# Patient Record
Sex: Male | Born: 2005 | Race: White | Hispanic: No | Marital: Single | Smoking: Never smoker
Health system: Southern US, Community
[De-identification: ages and names within clinical notes are randomized; demographics above are authoritative.]

## PROBLEM LIST (undated history)

## (undated) ENCOUNTER — Ambulatory Visit (HOSPITAL_COMMUNITY): Admission: EM | Payer: Medicaid Other | Source: Home / Self Care

## (undated) DIAGNOSIS — K5190 Ulcerative colitis, unspecified, without complications: Secondary | ICD-10-CM

## (undated) HISTORY — DX: Ulcerative colitis, unspecified, without complications: K51.90

---
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
## 2023-01-04 ENCOUNTER — Ambulatory Visit: Payer: Medicaid Other | Admitting: Family Medicine

## 2023-01-05 ENCOUNTER — Other Ambulatory Visit: Payer: Self-pay

## 2023-01-07 ENCOUNTER — Other Ambulatory Visit (HOSPITAL_COMMUNITY): Payer: Self-pay

## 2023-01-21 ENCOUNTER — Other Ambulatory Visit: Payer: Self-pay

## 2023-01-21 ENCOUNTER — Other Ambulatory Visit (INDEPENDENT_AMBULATORY_CARE_PROVIDER_SITE_OTHER): Payer: Medicaid Other

## 2023-01-21 ENCOUNTER — Ambulatory Visit (INDEPENDENT_AMBULATORY_CARE_PROVIDER_SITE_OTHER): Payer: Medicaid Other | Admitting: Sports Medicine

## 2023-01-21 ENCOUNTER — Encounter: Payer: Self-pay | Admitting: Sports Medicine

## 2023-01-21 DIAGNOSIS — M79604 Pain in right leg: Secondary | ICD-10-CM

## 2023-01-21 DIAGNOSIS — M76821 Posterior tibial tendinitis, right leg: Secondary | ICD-10-CM

## 2023-01-21 NOTE — Progress Notes (Signed)
Weeks of pain No injury Pain is in the medial ankle Wearing KT tape on that inner ankle

## 2023-01-21 NOTE — Progress Notes (Signed)
Shawn Novak - 17 y.o. male MRN [id number]  Date of birth: 2006-04-02  Office Visit Note: Visit Date: 01/21/2023 PCP: Silvano Rusk, MD Referred by: Silvano Rusk, MD  Subjective: Chief Complaint  Patient presents with   Right Ankle - Pain   HPI: Shawn Novak is a pleasant 17 y.o. male who presents today for evaluation of right medial lower leg pain that first began ~2 weeks ago.  Using index finger to localize pain, he point to medial lower leg, approximately 2 cm proximal & posterior to his medial malleolus. 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 He discontinued running for 1 week to allow injury to rest but continued with cross-training and started some rehab exercises he found online. Has also done some ice and KT tapping. Denies pain w/ ADLs or cross training. Slowly began running again this week but as he increased his daily mileage the pain returned. Has not used any oral or topical medications since onset of injury.   Pertinent ROS were reviewed with the patient and found to be negative unless otherwise specified above in HPI.   Assessment & Plan: Visit Diagnoses:  1. Posterior tibial tendinitis of right lower extremity   2. Pain in right leg    Plan: Discussed with Addison he has a form of posterior tibial tendinitis which is ultrasound and exam suggest a friction type syndrome with the overlying FDL and PT tendon.  Structurally the tendons looked sound and I do not see any evidence of bony abnormality on x-ray or ultrasound.  We will hold him for running for 2 weeks, he will continue crosstraining as well as some stretching and strengthening exercises for the posterior tibial tendon.  We did place him into green sports insoles with a scaphoid pad to help support his arch as he does have mild to moderate prominent pes cavus.  He will use ice as well as topical Voltaren gel over his  painful area.  Will limit oral NSAIDs given his history of stomach issues and Crohn's disease.  Follow-up: Return if symptoms worsen or fail to improve, for for leg (30-mins for Ultrasound if makes appt).   Meds & Orders: No orders of the defined types were placed in this encounter.   Orders Placed This Encounter  Procedures   XR Ankle Complete Right   [place] Extrem Low Right Ltd     Procedures: No procedures performed      Clinical History: No specialty comments available.  He reports that he has never smoked. He has never used smokeless tobacco. No results for input(s): "HGBA1C", "LABURIC" in the last 8760 hours.  Objective:    Physical Exam  Gen: Well-appearing, in no acute distress; non-toxic CV: Well-perfused. Warm.  Resp: Breathing unlabored on room air; no wheezing. Psych: Fluid speech in conversation; appropriate affect; normal thought process Neuro: Sensation intact throughout. No gross coordination deficits.   Ortho Exam No erythema, ecchymosis, or significant edema near near area of concern along path of posterior tibialis tendon.  Mild TTP over MT JNX of posterior tibialis. Denies TTP along anterior tibia or fibula Passive/active ROM within normal limits No reproducible pain with plantarflexion, dorsiflexion, inversion, or eversion of ankle Ankle anterior drawer testing shows adequate stability. Tinel sign negative when palpating over posterior tibial nerve. Denies any radiation of pain No evidence of neurovascular compromise  Imaging: [place] Extrem Low  Right Ltd  Result Date: 01/21/2023 Limited musculoskeletal ultrasound of the right lower extremity, right medial ankle was performed today.  Evaluation of the posterior tibial tendon showed a mild degree of hypoechoic fluid on the exterior part of the sheath approximately 3 inches proximal to the medial malleolus.  There is no cortical irregularity tear of the medial tibia.  There is associated sono palpation in this  region with the overlying flexor digitorum longus crossing over the posterior tibial tendon.  There is a reproducible clicking sensation when testing dynamically, likely indicative of a degree of intersection syndrome between the posterior tibial tendon and flexor digitorum longus.  This occurs just near the myotendinous junction of the PT tendon.  There is no evidence of high-grade tearing of either of these tendons.  XR Ankle Complete Right  Result Date: 01/21/2023 3 views of the right ankle including AP, oblique and lateral film were ordered and reviewed by myself.  X-rays demonstrate mild cavus foot.  No acute bony fracture or other bony abnormality noted.   Past Medical/Family/Surgical/Social History: Medications & Allergies reviewed per EMR, new medications updated. Patient Active Problem List   Diagnosis Date Noted   Stress fracture of fibula 02/05/2022   Strain of calf muscle 01/15/2021   Past Medical History:  Diagnosis Date   Ulcerative colitis (HCC)    History reviewed. No pertinent family history. History reviewed. No pertinent surgical history. Social History   Occupational History   Not on file  Tobacco Use   Smoking status: Never   Smokeless tobacco: Never  Vaping Use   Vaping Use: Never used  Substance and Sexual Activity   Alcohol use: No   Drug use: No   Sexual activity: Not on file

## 2023-01-27 ENCOUNTER — Other Ambulatory Visit: Payer: Self-pay

## 2023-01-27 ENCOUNTER — Other Ambulatory Visit (INDEPENDENT_AMBULATORY_CARE_PROVIDER_SITE_OTHER): Payer: Medicaid Other

## 2023-01-27 ENCOUNTER — Encounter: Payer: Self-pay | Admitting: Gastroenterology

## 2023-01-27 ENCOUNTER — Telehealth: Payer: Self-pay | Admitting: Gastroenterology

## 2023-01-27 DIAGNOSIS — K51011 Ulcerative (chronic) pancolitis with rectal bleeding: Secondary | ICD-10-CM | POA: Diagnosis not present

## 2023-01-27 DIAGNOSIS — R197 Diarrhea, unspecified: Secondary | ICD-10-CM | POA: Diagnosis not present

## 2023-01-27 LAB — CBC WITH DIFFERENTIAL/PLATELET
Basophils Absolute: 0.1 10*3/uL (ref 0.0–0.1)
Basophils Relative: 1 % (ref 0.0–3.0)
Eosinophils Absolute: 0.2 10*3/uL (ref 0.0–0.7)
Eosinophils Relative: 3.6 % (ref 0.0–5.0)
HCT: 41.7 % (ref 36.0–49.0)
Hemoglobin: 13.1 g/dL (ref 12.0–16.0)
Lymphocytes Relative: 21.1 % — ABNORMAL LOW (ref 24.0–48.0)
Lymphs Abs: 1.4 10*3/uL (ref 0.7–4.0)
MCHC: 31.4 g/dL (ref 31.0–37.0)
MCV: 77.6 fl — ABNORMAL LOW (ref 78.0–98.0)
Monocytes Absolute: 0.8 10*3/uL (ref 0.1–1.0)
Monocytes Relative: 11.2 % (ref 3.0–12.0)
Neutro Abs: 4.3 10*3/uL (ref 1.4–7.7)
Neutrophils Relative %: 63.1 % (ref 43.0–71.0)
Platelets: 344 10*3/uL (ref 150.0–575.0)
RBC: 5.37 Mil/uL (ref 3.80–5.70)
RDW: 17.9 % — ABNORMAL HIGH (ref 11.4–15.5)
WBC: 6.9 10*3/uL (ref 4.5–13.5)

## 2023-01-27 LAB — COMPREHENSIVE METABOLIC PANEL
ALT: 9 U/L (ref 0–53)
AST: 13 U/L (ref 0–37)
Albumin: 4.2 g/dL (ref 3.5–5.2)
Alkaline Phosphatase: 99 U/L (ref 52–171)
BUN: 23 mg/dL (ref 6–23)
CO2: 26 mEq/L (ref 19–32)
Calcium: 9.2 mg/dL (ref 8.4–10.5)
Chloride: 105 mEq/L (ref 96–112)
Creatinine, Ser: 1.03 mg/dL (ref 0.40–1.50)
GFR: 105.7 mL/min (ref >60.00)
Glucose, Bld: 93 mg/dL (ref 70–99)
Potassium: 3.8 mEq/L (ref 3.5–5.1)
Sodium: 138 mEq/L (ref 135–145)
Total Bilirubin: 1 mg/dL (ref 0.3–1.2)
Total Protein: 7.4 g/dL (ref 6.0–8.3)

## 2023-01-27 LAB — HIGH SENSITIVITY CRP: CRP, High Sensitivity: 2.52 mg/L (ref 0.000–5.000)

## 2023-01-27 NOTE — Telephone Encounter (Signed)
Left message for the patient to call or to send a message through My Chart.

## 2023-01-27 NOTE — Telephone Encounter (Signed)
PT confirmed labs are to be done in [place]. He is heading over today to proceed.

## 2023-01-27 NOTE — Telephone Encounter (Signed)
Patient has spoken with the provider. See his My Chart message. Called the patient again. No answer. Need to confirm if he is in [place] or if the labs are to be done through [facility].

## 2023-01-27 NOTE — Telephone Encounter (Signed)
Called Shawn Novak back, he is having recurrent acute flare of symptoms with 15-20 bowel movements per day and blood in stool.  He completed of prednisone taper 10 days ago and since he stopped prednisone he started noticing change in his bowel frequency and stool consistency.  His symptoms have progressively worsened over the last week. Advised him to come to lab this afternoon.  Please order Humira drug trough and antibody level, CBC, CMP, CRP, stool culture and C. difficile.  If negative for acute GI infection advised him to start prednisone taper again.  Please send prescription to his pharmacy prednisone 40 mg daily for 1 week and will taper down by 10 mg every week until he reaches 10 mg daily and then will decrease it to 5 mg daily for additional 1 week and stop If he is developing rejection to Humira or losing clinical response, will need to switch to alternate biologic based on Humira drug trough and antibody level. He has office follow-up visit in 2 weeks.

## 2023-01-27 NOTE — Telephone Encounter (Signed)
Inbound call from patient wanting to speak with a nurse in regards some issues he is having .Please advise

## 2023-01-28 ENCOUNTER — Ambulatory Visit: Payer: Medicaid Other

## 2023-01-28 DIAGNOSIS — R197 Diarrhea, unspecified: Secondary | ICD-10-CM

## 2023-01-28 DIAGNOSIS — K51011 Ulcerative (chronic) pancolitis with rectal bleeding: Secondary | ICD-10-CM

## 2023-01-29 LAB — CLOSTRIDIUM DIFFICILE BY PCR: Toxigenic C. Difficile by PCR: POSITIVE — AB

## 2023-01-31 LAB — STOOL CULTURE

## 2023-02-01 ENCOUNTER — Other Ambulatory Visit (HOSPITAL_COMMUNITY): Payer: Self-pay

## 2023-02-01 ENCOUNTER — Other Ambulatory Visit: Payer: Self-pay

## 2023-02-01 ENCOUNTER — Other Ambulatory Visit: Payer: Self-pay | Admitting: Gastroenterology

## 2023-02-01 ENCOUNTER — Telehealth: Payer: Self-pay | Admitting: Gastroenterology

## 2023-02-01 LAB — STOOL CULTURE: E coli, Shiga toxin Assay: NEGATIVE

## 2023-02-01 MED ORDER — DIFICID 200 MG PO TABS: 200.0000 mg | ORAL_TABLET | Freq: Two times a day (BID) | ORAL | 0 refills | Status: DC

## 2023-02-01 NOTE — Telephone Encounter (Signed)
Im sorry Shawn Novak Im just seeing this.  I have a electronic refill request for Prednisone for him and asked could I fill it

## 2023-02-01 NOTE — Telephone Encounter (Signed)
Inbound call from patient needing medication refill for Prednisone.. patient would also like to f/u on labs and stool sample. Please advise.

## 2023-02-01 NOTE — Telephone Encounter (Signed)
Dr Lavon Paganini, Patient is wanting refills of Prednisone refill request from pharmacy, can he have this refill. I seen that you sent him Dificid

## 2023-02-01 NOTE — Telephone Encounter (Signed)
Spoke with the patient. Advised of the positive C Difficile test. Advised he will not need to start prednisone and will be treated with antibiotics per Dr Lavon Paganini. Confirmed the pharmacy as [facility] on [address] [place]. Advised to encourage hydration.  Per Dr Lavon Paganini, Dificid 200 mg BID x 2 weeks.

## 2023-02-02 ENCOUNTER — Other Ambulatory Visit (HOSPITAL_COMMUNITY): Payer: Self-pay

## 2023-02-02 ENCOUNTER — Other Ambulatory Visit: Payer: Self-pay

## 2023-02-02 MED ORDER — VANCOMYCIN HCL 125 MG PO CAPS: 125.0000 mg | ORAL_CAPSULE | Freq: Four times a day (QID) | ORAL | 0 refills | Status: DC

## 2023-02-02 NOTE — Telephone Encounter (Signed)
Dificid will require PA. Do you want to pursue this or change to Vancomycin?

## 2023-02-02 NOTE — Telephone Encounter (Signed)
Per our conversation, Dificid prescription canceled. Vancomycin 125 mg QID x 14 days. Prescription transmitted to the [facility].

## 2023-02-02 NOTE — Telephone Encounter (Signed)
PT is calling to get an update on a prescription request. Requesting a call back

## 2023-02-04 NOTE — Telephone Encounter (Signed)
Called Shawn Novak, he is feeling slightly better though he is continue to have multiple bowel movements per day, has noticed slight decrease in frequency. He is taking vancomycin 4 times daily, advised him to call or send [place] a message next week on Wednesday to inform his progress.  Will likely plan for a taper dose of vancomycin if he has persistent diarrhea.  He is due for injection of Humira next Thursday, advised him to proceed as per schedule if his symptoms are improving.

## 2023-02-06 LAB — ADALIMUMAB+AB (SERIAL MONITOR): Adalimumab Drug Level: 19 ug/mL

## 2023-02-07 LAB — SERIAL MONITORING

## 2023-02-08 ENCOUNTER — Telehealth: Payer: Self-pay | Admitting: Gastroenterology

## 2023-02-08 NOTE — Telephone Encounter (Signed)
Inbound call from patient stating he has not seen any results from antibiotics that he was prescribed. Requesting a call back to discuss further. Please advise, thank you.

## 2023-02-08 NOTE — Telephone Encounter (Signed)
Continue vancomycin qid as prescribed Diet: low fat, low fiber (no raw fruits, no raw vegetables), lactose-free, no caffeine Further advice and mgmt per Dr. Lavon Paganini, his primary gastroenterologist, who will be in the office tomorrow

## 2023-02-08 NOTE — Telephone Encounter (Signed)
Patient father stopped by office again this afternoon. States he will be here at 8 am tomorrow morning (6/11) and wait until dificid is sent and prior Berkley Harvey is started. Went over Dr.Starks recommendation and also advised him a MyChart message was sent to his son (patient).

## 2023-02-08 NOTE — Telephone Encounter (Signed)
Patient called regarding recent messages. States he is requesting to have the prior authorization completed for new medication so he is able to pick it up as soon as possible. Does not know what medication needs prior authorization. Advised when Dr. Lavon Paganini returns she will make further recommendations. Requesting a call back to discuss this. Please advise, thank you.

## 2023-02-08 NOTE — Telephone Encounter (Signed)
Good morning Dr.Stark, Doc of day 6/10 am Patient father Doreatha Martin is in office wanting answers for his son. States antibiotic (vancomycin) is not helping, patient is having 20 bowel movements a day. Would like a call back to further discuss options.

## 2023-02-09 ENCOUNTER — Other Ambulatory Visit: Payer: Self-pay

## 2023-02-09 ENCOUNTER — Other Ambulatory Visit (HOSPITAL_COMMUNITY): Payer: Self-pay

## 2023-02-09 ENCOUNTER — Telehealth: Payer: Self-pay

## 2023-02-09 MED ORDER — DIFICID 200 MG PO TABS: 200.0000 mg | ORAL_TABLET | Freq: Two times a day (BID) | ORAL | 0 refills | Status: DC

## 2023-02-09 MED ORDER — DIFICID 200 MG PO TABS
200.0000 mg | ORAL_TABLET | Freq: Two times a day (BID) | ORAL | 0 refills | Status: DC
  Filled 2023-02-09: qty 20, 10d supply, fill #0

## 2023-02-09 NOTE — Telephone Encounter (Signed)
[facility] does not have medication in stock. Canceled the prescription at [facility]. Moved Rx to [facility]. Patient aware.

## 2023-02-09 NOTE — Telephone Encounter (Signed)
Please send a PA for Dificid 200 mg 1 BID x 10 days. Thank you.

## 2023-02-09 NOTE — Telephone Encounter (Signed)
Spoke to Dr Lavon Paganini, she states that patient can be given dificid twice daily x 10 days (the normal dosing) and extend it to 14 days if not completely effective after 10 days. New rx sent to pharmacy ([facility] [place]) and prior [place] obtained. Patient advised.

## 2023-02-09 NOTE — Telephone Encounter (Signed)
Spoke with patient and confirmed the plan of care. Confirmed pharmacy.  Prescription sent to [facility] as requested. Patient will call us with any further concerns.

## 2023-02-09 NOTE — Telephone Encounter (Signed)
[employer] (Key: [id number]) PA Case ID #: [id number] Need Help? Call us at [phone] Status Sent Sent to Plan today Drug Dificid 200MG  tablets Form [employer] [facility] Electronic [place] Form [phone] NCPDP)

## 2023-02-09 NOTE — Telephone Encounter (Signed)
Please send prescription for Dificid 200 mg twice daily for 14 days given he is not improving on oral vancomycin.

## 2023-02-09 NOTE — Telephone Encounter (Signed)
[employer] (Key: [id number]) PA Case ID #: [id number] Need Help? Call us at [phone] Outcome Approved today PA Case: [id number],  Status: Approved, Coverage Starts on: 02/09/2023 12:00:00 AM, Coverage Ends on: 02/09/2024 12:00:00 AM. Authorization Expiration Date: 02/08/2024

## 2023-02-14 ENCOUNTER — Other Ambulatory Visit: Payer: Self-pay

## 2023-02-14 ENCOUNTER — Inpatient Hospital Stay (HOSPITAL_COMMUNITY)
Admission: EM | Admit: 2023-02-14 | Discharge: 2023-02-16 | DRG: 386 | Disposition: A | Discharge status: Home / Self Care | Payer: Medicaid Other | Attending: Internal Medicine | Admitting: Internal Medicine

## 2023-02-14 DIAGNOSIS — D649 Anemia, unspecified: Secondary | ICD-10-CM | POA: Diagnosis present

## 2023-02-14 DIAGNOSIS — K529 Noninfective gastroenteritis and colitis, unspecified: Secondary | ICD-10-CM | POA: Diagnosis present

## 2023-02-14 DIAGNOSIS — E876 Hypokalemia: Secondary | ICD-10-CM | POA: Diagnosis present

## 2023-02-14 DIAGNOSIS — F419 Anxiety disorder, unspecified: Secondary | ICD-10-CM | POA: Diagnosis present

## 2023-02-14 DIAGNOSIS — R197 Diarrhea, unspecified: Secondary | ICD-10-CM

## 2023-02-14 DIAGNOSIS — Z1152 Encounter for screening for COVID-19: Secondary | ICD-10-CM

## 2023-02-14 DIAGNOSIS — K5100 Ulcerative (chronic) pancolitis without complications: Principal | ICD-10-CM | POA: Diagnosis present

## 2023-02-14 DIAGNOSIS — E441 Mild protein-calorie malnutrition: Secondary | ICD-10-CM | POA: Diagnosis present

## 2023-02-14 DIAGNOSIS — Z79620 Long term (current) use of immunosuppressive biologic: Secondary | ICD-10-CM

## 2023-02-14 DIAGNOSIS — Z68.41 Body mass index (BMI) pediatric, less than 5th percentile for age: Secondary | ICD-10-CM

## 2023-02-14 DIAGNOSIS — K5190 Ulcerative colitis, unspecified, without complications: Secondary | ICD-10-CM | POA: Diagnosis present

## 2023-02-14 DIAGNOSIS — R109 Unspecified abdominal pain: Secondary | ICD-10-CM

## 2023-02-14 LAB — DIFFERENTIAL
Abs Immature Granulocytes: 0.02 10*3/uL (ref 0.00–0.07)
Basophils Absolute: 0.1 10*3/uL (ref 0.0–0.1)
Basophils Relative: 1 %
Eosinophils Absolute: 0.8 10*3/uL — ABNORMAL HIGH (ref 0.0–0.5)
Eosinophils Relative: 8 %
Immature Granulocytes: 0 %
Lymphocytes Relative: 19 %
Lymphs Abs: 2 10*3/uL (ref 0.7–4.0)
Monocytes Absolute: 0.9 10*3/uL (ref 0.1–1.0)
Monocytes Relative: 9 %
Neutro Abs: 6.7 10*3/uL (ref 1.7–7.7)
Neutrophils Relative %: 63 %

## 2023-02-14 LAB — COMPREHENSIVE METABOLIC PANEL
ALT: 15 U/L (ref 0–44)
AST: 14 U/L — ABNORMAL LOW (ref 15–41)
Albumin: 3.8 g/dL (ref 3.5–5.0)
Alkaline Phosphatase: 92 U/L (ref 38–126)
Anion gap: 7 (ref 5–15)
BUN: 25 mg/dL — ABNORMAL HIGH (ref 6–20)
CO2: 26 mmol/L (ref 22–32)
Calcium: 8.8 mg/dL — ABNORMAL LOW (ref 8.9–10.3)
Chloride: 103 mmol/L (ref 98–111)
Creatinine, Ser: 0.85 mg/dL (ref 0.61–1.24)
GFR, Estimated: >60 mL/min (ref >60)
Glucose, Bld: 95 mg/dL (ref 70–99)
Potassium: 3.4 mmol/L — ABNORMAL LOW (ref 3.5–5.1)
Sodium: 136 mmol/L (ref 135–145)
Total Bilirubin: 0.7 mg/dL (ref 0.3–1.2)
Total Protein: 7.3 g/dL (ref 6.5–8.1)

## 2023-02-14 LAB — LIPASE, BLOOD: Lipase: 31 U/L (ref 11–51)

## 2023-02-14 LAB — CBC
HCT: 42.5 % (ref 39.0–52.0)
Hemoglobin: 12.8 g/dL — ABNORMAL LOW (ref 13.0–17.0)
MCH: 24.3 pg — ABNORMAL LOW (ref 26.0–34.0)
MCHC: 30.1 g/dL (ref 30.0–36.0)
MCV: 80.8 fL (ref 80.0–100.0)
Platelets: 448 10*3/uL — ABNORMAL HIGH (ref 150–400)
RBC: 5.26 MIL/uL (ref 4.22–5.81)
RDW: 15.7 % — ABNORMAL HIGH (ref 11.5–15.5)
WBC: 10.5 10*3/uL (ref 4.0–10.5)
nRBC: 0 % (ref 0.0–0.2)

## 2023-02-14 LAB — LACTIC ACID, PLASMA
Lactic Acid, Venous: 1 mmol/L (ref 0.5–1.9)
Lactic Acid, Venous: 0.7 mmol/L (ref 0.5–1.9)

## 2023-02-14 LAB — TYPE AND SCREEN
ABO/RH(D): O POS
Antibody Screen: NEGATIVE

## 2023-02-14 LAB — C-REACTIVE PROTEIN: CRP: 0.6 mg/dL (ref <1.0)

## 2023-02-14 LAB — SEDIMENTATION RATE: Sed Rate: 7 mm/hr (ref 0–16)

## 2023-02-14 LAB — MAGNESIUM: Magnesium: 2 mg/dL (ref 1.7–2.4)

## 2023-02-14 LAB — TSH: TSH: 1.038 u[IU]/mL (ref 0.350–4.500)

## 2023-02-14 LAB — PHOSPHORUS: Phosphorus: 3.6 mg/dL (ref 2.5–4.6)

## 2023-02-14 MED ORDER — LACTATED RINGERS IV SOLN: INTRAVENOUS | Status: AC

## 2023-02-14 MED ORDER — POTASSIUM CHLORIDE CRYS ER 20 MEQ PO TBCR
30.0000 meq | EXTENDED_RELEASE_TABLET | ORAL | Status: AC
  Administered 2023-02-14 (×2): 30 meq via ORAL
  Filled 2023-02-14 (×2): qty 1

## 2023-02-14 MED ORDER — ACETAMINOPHEN 325 MG PO TABS: 650.0000 mg | ORAL_TABLET | Freq: Four times a day (QID) | ORAL | Status: DC | PRN

## 2023-02-14 MED ORDER — HEPARIN SODIUM (PORCINE) 5000 UNIT/ML IJ SOLN
5000.0000 [IU] | Freq: Three times a day (TID) | INTRAMUSCULAR | Status: DC
  Administered 2023-02-14 – 2023-02-16 (×5): 5000 [IU] via SUBCUTANEOUS
  Filled 2023-02-14 (×6): qty 1

## 2023-02-14 MED ORDER — ACETAMINOPHEN 650 MG RE SUPP: 650.0000 mg | Freq: Four times a day (QID) | RECTAL | Status: DC | PRN

## 2023-02-14 MED ORDER — SODIUM CHLORIDE 0.9% FLUSH
3.0000 mL | Freq: Two times a day (BID) | INTRAVENOUS | Status: DC
  Administered 2023-02-14 – 2023-02-16 (×4): 3 mL via INTRAVENOUS

## 2023-02-14 MED ORDER — METHYLPREDNISOLONE SODIUM SUCC 40 MG IJ SOLR
40.0000 mg | INTRAMUSCULAR | Status: DC
  Administered 2023-02-14 – 2023-02-16 (×3): 40 mg via INTRAVENOUS
  Filled 2023-02-14 (×3): qty 1

## 2023-02-14 MED ORDER — SODIUM CHLORIDE 0.9 % IV BOLUS
1000.0000 mL | Freq: Once | INTRAVENOUS | Status: AC
  Administered 2023-02-14: 1000 mL via INTRAVENOUS

## 2023-02-14 MED ORDER — FIDAXOMICIN 200 MG PO TABS
200.0000 mg | ORAL_TABLET | Freq: Two times a day (BID) | ORAL | Status: DC
  Administered 2023-02-14 – 2023-02-15 (×2): 200 mg via ORAL
  Filled 2023-02-14 (×5): qty 1

## 2023-02-14 NOTE — Consult Note (Signed)
CONSULT NOTE FOR [facility]  Reason for Consult: Pan UC and C diff Referring Physician: [facility]  [name] HPI: This is a 17 year old male with a PMH of pan UC that was diagnosed at [facility] on 07/15/2022.  He was noted to have a moderate inflammation throughout his entire colon and the biopsies a severe chronic active colitis.  He was treated with budesonide and Apriso.  He subsequently transitioned his care to Dr. Lavon Paganini for persistent bloody diarrhea and abdominal pain.  On average he has 20-30 diarrheal bowel movements/day  Dr. Lavon Paganini discussed the options of treatment for him and he was advised to start on Humira, but he was reluctant as he has a phobia with needles.  There was an inquiry about using Rinvoq, but this was denied as he did not fail an anti-TNF.  After some time the patient did start on Humira and prednisone with good results.  Prior to Humira he did use a couple of tapering doses of prednisone.  He states that he was placed on Humira 3 months ago.  Once he transitioned off of prednisone he started to experience a recurrence of his symptoms.  With combination treatment he was able to achieve 2-3 formed bowel movements per day, but that remission state was transient.  On Humira monotherapy he reports having 15-30 diarrheal bowel movements per day.  He was checked for C diff using EIA and it was negative on 11/05/2022.  Fecal calprotectin on that date was elevated at 991 as well as his CRP.  He continued to have problems with diarrhea and on 01/27/2023 he was checked for C diff using the PCR and it was positive.  He was started on vancomycin, but he did not respond.  Approximately one week ago he was started on Dificid and he continues to have symptoms.  In the ER his blood work appears relatively stable in spite of his diarrheal frequency.  His Humira trough and antibody level were within acceptable limits.  Past Medical History:  Diagnosis Date   Ulcerative colitis (HCC)      No past surgical history on file.  No family history on file.  Social History:  reports that he has never smoked. He has never used smokeless tobacco. He reports that he does not drink alcohol and does not use drugs.  Allergies: No Known Allergies  Medications: Scheduled:  fidaxomicin  200 mg Oral BID   methylPREDNISolone (SOLU-MEDROL) injection  40 mg Intravenous Q24H   Continuous:  Results for orders placed or performed during the hospital encounter of 02/14/23 (from the past 24 hour(s))  Comprehensive metabolic panel     Status: Abnormal   Collection Time: 02/14/23  1:20 PM  Result Value Ref Range   Sodium 136 135 - 145 mmol/L   Potassium 3.4 (L) 3.5 - 5.1 mmol/L   Chloride 103 98 - 111 mmol/L   CO2 26 22 - 32 mmol/L   Glucose, Bld 95 70 - 99 mg/dL   BUN 25 (H) 6 - 20 mg/dL   Creatinine, Ser 4.09 0.61 - 1.24 mg/dL   Calcium 8.8 (L) 8.9 - 10.3 mg/dL   Total Protein 7.3 6.5 - 8.1 g/dL   Albumin 3.8 3.5 - 5.0 g/dL   AST 14 (L) 15 - 41 U/L   ALT 15 0 - 44 U/L   Alkaline Phosphatase 92 38 - 126 U/L   Total Bilirubin 0.7 0.3 - 1.2 mg/dL   GFR, Estimated >81 >19 mL/min  Anion gap 7 5 - 15  CBC     Status: Abnormal   Collection Time: 02/14/23  1:20 PM  Result Value Ref Range   WBC 10.5 4.0 - 10.5 K/uL   RBC 5.26 4.22 - 5.81 MIL/uL   Hemoglobin 12.8 (L) 13.0 - 17.0 g/dL   HCT 16.1 09.6 - 04.5 %   MCV 80.8 80.0 - 100.0 fL   MCH 24.3 (L) 26.0 - 34.0 pg   MCHC 30.1 30.0 - 36.0 g/dL   RDW 40.9 (H) 81.1 - 91.4 %   Platelets 448 (H) 150 - 400 K/uL   nRBC 0.0 0.0 - 0.2 %  Type and screen [facility]     Status: None   Collection Time: 02/14/23  1:20 PM  Result Value Ref Range   ABO/RH(D) O POS    Antibody Screen NEG    Sample Expiration      02/17/2023,2359 Performed at [facility], [address], [place], [place] [address]   Differential     Status: Abnormal   Collection Time: 02/14/23  1:20 PM  Result Value Ref Range    Neutrophils Relative % 63 %   Neutro Abs 6.7 1.7 - 7.7 K/uL   Lymphocytes Relative 19 %   Lymphs Abs 2.0 0.7 - 4.0 K/uL   Monocytes Relative 9 %   Monocytes Absolute 0.9 0.1 - 1.0 K/uL   Eosinophils Relative 8 %   Eosinophils Absolute 0.8 (H) 0.0 - 0.5 K/uL   Basophils Relative 1 %   Basophils Absolute 0.1 0.0 - 0.1 K/uL   Immature Granulocytes 0 %   Abs Immature Granulocytes 0.02 0.00 - 0.07 K/uL  Lipase, blood     Status: None   Collection Time: 02/14/23  1:20 PM  Result Value Ref Range   Lipase 31 11 - 51 U/L  Magnesium     Status: None   Collection Time: 02/14/23  1:20 PM  Result Value Ref Range   Magnesium 2.0 1.7 - 2.4 mg/dL     No results found.  ROS:  As stated above in the HPI otherwise negative.  Blood pressure 118/70, pulse (!) 58, temperature 98.8 F (37.1 C), temperature source Oral, resp. rate 16, height 5\' 6"  (1.676 m), weight 52.2 kg, SpO2 100 %.    PE: Gen: NAD, Alert and Oriented HEENT:  [place]/AT, EOMI Neck: Supple, no LAD Lungs: CTA Bilaterally CV: RRR without M/G/R ABD: Flat, Soft, NTND, +BS, no rebound or rigidity - no evidence of an acute abdomen Ext: No C/C/E  Assessment/Plan: 1) Pan UC. 2) ? C diff. 3) Mild anemia. 4) Anxiety.   The patient's chart was reviewed in detail.  The current confounding factor is if he truly has C diff.  The EIA was negative, but his PCR was positive.  From his history, it seems that his symptoms are more from his UC.  The best indication of his control was with combination therapy, but then transitioning to monotherapy with Humira he started to have a recurrence of his symptoms.  His symptoms are same as when he presented for his diagnosis in November.  I spoke at great length with the patient and his father.  Both are very anxious people and this was also reflected with the numerous encounters in the chart.  The plan is to check his C. Diff again with EIA and PCR.  A GDH is not possible to order in the system.  He will  remain on Dificid  for now and he will be started on Solumedrol.  The patient is not toxic in appearance and he is afebrile.  Also other stool pathogens will be checked as he does work at [employer].  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  It may also be beneficial to perform an FFS to obtain biopsies.  This will help with documenting a failure of Humira to transition to Rinvoq.  Plan: 1) Check C diff EIA and PCR. 2) Solumedrol 40 mg IV every day. 3) GI pathogen panel. 4) ? FFS tomorrow. 5) [facility] will assume care in the AM.  Nyla Creason D 02/14/2023, 3:45 PM

## 2023-02-14 NOTE — ED Provider Notes (Signed)
[place] EMERGENCY DEPARTMENT AT [facility] Provider Note   CSN: [id number] Arrival date & time: 02/14/23  1247     History  Chief Complaint  Patient presents with   Diarrhea   Rectal Bleeding    CODEN Reynoldsburg is a 17 y.o. male with a history of ulcerative colitis, on Humira and mesalamine, presenting to the ED with abdominal pain and cramping diarrhea.  This been ongoing for about 2 months per his report.  The patient was diagnosed with C. difficile colitis about 1 month ago.  He completed 7 days of oral vancomycin no improvement of his symptoms, was then switched to Dificid recently, and has currently completed 6 days of Dificid.  Still no improvement of his diarrhea.  Reports about 20-30 loose, nonbloody bowel movements, significant cramping abdominal pain during bowel movements per day.  Poor oral intake.  His father at bedside reports the patient "only eats chicken and eggs".  No history of abdominal surgery.  HPI     Home Medications Prior to Admission medications   Medication Sig Start Date End Date Taking? Authorizing Provider  Adalimumab (HUMIRA, 2 PEN,) 40 MG/0.4ML PNKT Inject 40 mg into the skin every 14 (fourteen) days. Start on day 29 12/10/22  Yes Nandigam, Eleonore Chiquito, MD  fidaxomicin (DIFICID) 200 MG TABS tablet Take 1 tablet (200 mg) by mouth 2 times daily for 10 days. 02/09/23 02/19/23 Yes Nandigam, Eleonore Chiquito, MD  mesalamine (APRISO) 0.375 g 24 hr capsule Take 4 capsules (1.5 g total) by mouth daily. 11/04/22  Yes Nandigam, Eleonore Chiquito, MD  Adalimumab (HUMIRA-CD/UC/HS STARTER) 80 MG/0.8ML PNKT Inject 160 mg SQ day one 1 then inject 80 mg day 15  Disp 1 starter kit Patient not taking: Reported on 02/14/2023 12/11/22   Napoleon Form, MD  dicyclomine (BENTYL) 20 MG tablet Take 1 tablet (20 mg total) by mouth every 8 (eight) hours as needed (abdominal cramping/diarrhea.). Patient not taking: Reported on 11/04/2022 02/20/22   Petrucelli, Lelon Mast R, PA-C   ferrous sulfate 324 (65 Fe) MG TBEC Take 1 tablet (325 mg total) by mouth daily. Patient not taking: Reported on 02/14/2023 11/26/22 05/25/23  Madelyn Brunner, DO  hydrocortisone (ANUSOL-HC) 2.5 % rectal cream Place 1 Application rectally 2 (two) times daily as needed for hemorrhoids (rectal bleeding/pain). Patient not taking: Reported on 11/04/2022 02/20/22   Petrucelli, Pleas Koch, PA-C  pantoprazole (PROTONIX) 40 MG tablet Take 1 tablet (40 mg total) by mouth daily. Patient not taking: Reported on 11/04/2022 02/20/22   Petrucelli, Pleas Koch, PA-C  predniSONE (DELTASONE) 10 MG tablet Prednisone 20mg  twice daily for 1 week and then slowly taper down by 5 mg every week. Patient not taking: Reported on 02/14/2023 12/01/22   Napoleon Form, MD  vancomycin (VANCOCIN) 125 MG capsule Take 1 capsule (125 mg total) by mouth 4 (four) times daily for 14 days. Patient not taking: Reported on 02/14/2023 02/02/23 02/16/23  Napoleon Form, MD      Allergies    Patient has no known allergies.    Review of Systems   Review of Systems  Physical Exam Updated Vital Signs BP 118/70   Pulse (!) 58   Temp 98.8 F (37.1 C)   Resp 16   Ht 5\' 6"  (1.676 m)   Wt 52.2 kg   SpO2 100%   BMI 18.56 kg/m  Physical Exam Constitutional:      General: He is not in acute distress. HENT:     Head: Normocephalic  and atraumatic.  Eyes:     Conjunctiva/sclera: Conjunctivae normal.     Pupils: Pupils are equal, round, and reactive to light.  Cardiovascular:     Rate and Rhythm: Normal rate and regular rhythm.  Pulmonary:     Effort: Pulmonary effort is normal. No respiratory distress.  Abdominal:     General: There is no distension.     Tenderness: There is abdominal tenderness. There is no guarding or rebound.  Skin:    General: Skin is warm and dry.  Neurological:     General: No focal deficit present.     Mental Status: He is alert. Mental status is at baseline.  Psychiatric:        Mood and Affect: Mood  normal.        Behavior: Behavior normal.     ED Results / Procedures / Treatments   Labs (all labs ordered are listed, but only abnormal results are displayed) Labs Reviewed  COMPREHENSIVE METABOLIC PANEL - Abnormal; Notable for the following components:      Result Value   Potassium 3.4 (*)    BUN 25 (*)    Calcium 8.8 (*)    AST 14 (*)    All other components within normal limits  CBC - Abnormal; Notable for the following components:   Hemoglobin 12.8 (*)    MCH 24.3 (*)    RDW 15.7 (*)    Platelets 448 (*)    All other components within normal limits  DIFFERENTIAL - Abnormal; Notable for the following components:   Eosinophils Absolute 0.8 (*)    All other components within normal limits  GASTROINTESTINAL PANEL BY PCR, STOOL (REPLACES STOOL CULTURE)  LIPASE, BLOOD  MAGNESIUM  SEDIMENTATION RATE  C-REACTIVE PROTEIN  LACTIC ACID, PLASMA  LACTIC ACID, PLASMA  POC OCCULT BLOOD, ED  TYPE AND SCREEN    EKG None  Radiology No results found.  Procedures Procedures    Medications Ordered in ED Medications  methylPREDNISolone sodium succinate (SOLU-MEDROL) 40 mg/mL injection 40 mg (has no administration in time range)  fidaxomicin (DIFICID) tablet 200 mg (has no administration in time range)  sodium chloride 0.9 % bolus 1,000 mL (0 mLs Intravenous Stopped 02/14/23 1633)    ED Course/ Medical Decision Making/ A&P Clinical Course as of 02/14/23 1716  Sun Feb 14, 2023  1424 Dr Elnoria Howard to see patient, GI [MT]    Clinical Course User Index [MT] Caryssa Elzey, Kermit Balo, MD                             Medical Decision Making Amount and/or Complexity of Data Reviewed Labs: ordered.  Risk Decision regarding hospitalization.   This patient presents to the ED with concern for diarrhea, generalized abdominal tenderness. This involves an extensive number of treatment options, and is a complaint that carries with it a high risk of complications and morbidity.  The  differential diagnosis includes ulcerative colitis exacerbation versus superimposed infectious colitis versus C. difficile versus other intra-abdominal process versus other  Co-morbidities that complicate the patient evaluation: History of ulcerative colitis at high risk of bowel inflammation  Additional history obtained from patient's father at the bedside  External records from outside source obtained and reviewed including C Diff positive on 01/27/23, stool culture otherwise negative (for Shiga toxin E. coli, Campylobacter, Salmonella)  I ordered and personally interpreted labs.  The pertinent results include:  K 3.4, Mag wnl, lipase and lft wnl, WBC  wnl.  I ordered medication including IV fluids for hydration  I have reviewed the patients home medicines and have made adjustments as needed  Test Considered: I have a lower suspicion clinically for acute appendicitis or other surgical emergency of the abdomen, and I do not see an emergent indication for CT imaging, but will defer this consideration to gastroenterologist if they are concerned about colitis complications.  I requested consultation with the Dr Elnoria Howard from Gastroenterology (covering for [facility] this weekend),  and discussed lab and imaging findings as well as pertinent plan - they recommend: They will evaluate the patient at the bedside  After the interventions noted above, I reevaluated the patient and found that they have: stayed the same   Dispostion:  After consideration of the diagnostic results and the patients response to treatment, I feel that the patient would benefit from medical admission.         Final Clinical Impression(s) / ED Diagnoses Final diagnoses:  Diarrhea, unspecified type  Abdominal pain, unspecified abdominal location    Rx / DC Orders ED Discharge Orders     None         Terald Sleeper, MD 02/14/23 [phone]

## 2023-02-14 NOTE — ED Triage Notes (Addendum)
Pt arrived via POV. Pt c/o severe abd pain, 20-30 liquids BM w/blood a day for 3x weeks. Tested + for C diff 2x weeks and has been on abx. Pt has hx UC.   AOX4

## 2023-02-14 NOTE — H&P (Addendum)
History and Physical    PACER WEHR ZOX:[id number] DOB: [birth year] DOA: 02/14/2023  PCP: Silvano Rusk, MD  Patient coming from: home  Chief Complaint: lots of crampy bloody diarrhea  HPI: Shawn Novak is a 17 y.o. male  with a history of pan Ulcerative colitis on Humira and mesalamine who presents to the ED with abdominal pain and cramping diarrhea.   Pt states that about the timeline of decreasin prednisone he started havin emergence of bloody, mucusy, diarrhea, he has some abdominal pain around bowel movements and thinks that he has 20-30/day, smaller in size with some blood mixed in..  He has been trying to taper off prednisone and on humira and each time has reemergence of symptoms. March where EIA was eventually negative and empirically placed on vancomycin without much improvement.  And, as I understand, another taper recently with reemergence of symptoms and was about to restart prednisone taper again if infectious work up was negative but C.diff PCR was positive.  Patient was on vancomycin empirically and then switched to dificid after some insurance concerns.  He didn't feel like either of these helped.   This is third attempt he reports.  He gets abdominal cramping and then proceeds to a bowel movement.    C.diff test EIA in March was negative after he had started some vancomycin empirically, he reports, without much improvement in symptoms.  c.diff PCR was positive on 01/27/2023   Also notes, per Dr. Elnoria Howard, "Also other stool pathogens will be checked as he does work at [employer]."  But denies any other suspect exposures like drinking river water.    In the ED, 98.8, 52.2 kg, RR 16, HR 58, 118/70, 100% on room air, NA 136, K3.4, CO2 26, BUN 25, SCR 0.85, calcium 8.8, LFTs okay, lipase 31. Receieved 1 L of NS.  Gastroenterology was consulted and theorized that FFS might be done tomorrow to take a look and keep NPO at midnight.  With plan for IV  solumedrol 40mg  daily and has received 1L of normal saline.  CT scan was not thought to be needed here  Pt and family had a fair amount of questions and tried to answer them all.    Review of Systems: As per HPI otherwise 10 point review of systems negative.  Other pertinents as below:  General -  HEENT -  Cardio -  Resp -  GI - as per HPI GU -  MSK -  Skin -  Neuro - denies any new HA's or new numbness or weaekness Psych -  Denies any new headaches, visual changes, f/c Denies any cp, palpitations Denies any recent illness, sob, cough GU denies any new sexual partners, no dysuria or pain with urination, states back when he was dx'ed he had a UTI. MSK - denies any new jt pains or muscle aches Skin - no new skin rashes or lesions   Past Medical History:  Diagnosis Date   Ulcerative colitis (HCC)     No past surgical history on file.   reports that he has never smoked. He has never used smokeless tobacco. He reports that he does not drink alcohol and does not use drugs.  No Known Allergies  No family history on file. Father has a history of UC as well    Prior to Admission medications   Medication Sig Start Date End Date Taking? Authorizing Provider  Adalimumab (HUMIRA, 2 PEN,) 40 MG/0.4ML PNKT Inject 40 mg into  the skin every 14 (fourteen) days. Start on day 29 12/10/22  Yes Nandigam, Eleonore Chiquito, MD  fidaxomicin (DIFICID) 200 MG TABS tablet Take 1 tablet (200 mg) by mouth 2 times daily for 10 days. 02/09/23 02/19/23 Yes Nandigam, Eleonore Chiquito, MD  mesalamine (APRISO) 0.375 g 24 hr capsule Take 4 capsules (1.5 g total) by mouth daily. 11/04/22  Yes Nandigam, Eleonore Chiquito, MD  Adalimumab (HUMIRA-CD/UC/HS STARTER) 80 MG/0.8ML PNKT Inject 160 mg SQ day one 1 then inject 80 mg day 15  Disp 1 starter kit Patient not taking: Reported on 02/14/2023 12/11/22   Napoleon Form, MD  dicyclomine (BENTYL) 20 MG tablet Take 1 tablet (20 mg total) by mouth every 8 (eight) hours as needed  (abdominal cramping/diarrhea.). Patient not taking: Reported on 11/04/2022 02/20/22   Petrucelli, Lelon Mast R, PA-C  ferrous sulfate 324 (65 Fe) MG TBEC Take 1 tablet (325 mg total) by mouth daily. Patient not taking: Reported on 02/14/2023 11/26/22 05/25/23  Madelyn Brunner, DO  hydrocortisone (ANUSOL-HC) 2.5 % rectal cream Place 1 Application rectally 2 (two) times daily as needed for hemorrhoids (rectal bleeding/pain). Patient not taking: Reported on 11/04/2022 02/20/22   Petrucelli, Pleas Koch, PA-C  pantoprazole (PROTONIX) 40 MG tablet Take 1 tablet (40 mg total) by mouth daily. Patient not taking: Reported on 11/04/2022 02/20/22   Petrucelli, Pleas Koch, PA-C  predniSONE (DELTASONE) 10 MG tablet Prednisone 20mg  twice daily for 1 week and then slowly taper down by 5 mg every week. Patient not taking: Reported on 02/14/2023 12/01/22   Napoleon Form, MD  vancomycin (VANCOCIN) 125 MG capsule Take 1 capsule (125 mg total) by mouth 4 (four) times daily for 14 days. Patient not taking: Reported on 02/14/2023 02/02/23 02/16/23  Napoleon Form, MD    Physical Exam: Vitals:   02/14/23 1258 02/14/23 1300 02/14/23 1600  BP:  118/70   Pulse:  (!) 58   Resp: 16    Temp: 98.8 F (37.1 C)  98.8 F (37.1 C)  TempSrc: Oral    SpO2:  100%   Weight: 52.2 kg    Height: 5\' 6"  (1.676 m)      Constitutional: NAD, comfortable, pleasant, thin Eyes: pupils equal and reactive to light, anicteric, without injection ENMT: MMM, throat without exudates or erythema Neck: normal, supple, no masses, no thyromegaly noted Respiratory: CTAB, nwob  Cardiovascular: rrr w/o mrg, warm extremities Abdomen: hyperactive bs's, no guarding, soft and negative acute abdomen signs Musculoskeletal: moving all 4 extremities, strength grossly intact 5/5 in the UE and LE's, Skin: no rashes, lesions, ulcers. No induration Neurologic: CN 2-12 grossly intact. Sensation intact Psychiatric: AO appearing, mentation appropriate   Labs on  Admission: I have personally reviewed following labs and imaging studies  CBC: Recent Labs  Lab 02/14/23 1320  WBC 10.5  NEUTROABS 6.7  HGB 12.8*  HCT 42.5  MCV 80.8  PLT 448*   Basic Metabolic Panel: Recent Labs  Lab 02/14/23 1320  NA 136  K 3.4*  CL 103  CO2 26  GLUCOSE 95  BUN 25*  CREATININE 0.85  CALCIUM 8.8*  MG 2.0   GFR: Estimated Creatinine Clearance: 103.2 mL/min (by C-G formula based on SCr of 0.85 mg/dL). Liver Function Tests: Recent Labs  Lab 02/14/23 1320  AST 14*  ALT 15  ALKPHOS 92  BILITOT 0.7  PROT 7.3  ALBUMIN 3.8   Recent Labs  Lab 02/14/23 1320  LIPASE 31   No results for input(s): "AMMONIA" in the last 168  hours. Coagulation Profile: No results for input(s): "INR", "PROTIME" in the last 168 hours. Cardiac Enzymes: No results for input(s): "CKTOTAL", "CKMB", "CKMBINDEX", "TROPONINI" in the last 168 hours. BNP (last 3 results) No results for input(s): "PROBNP" in the last 8760 hours. HbA1C: No results for input(s): "HGBA1C" in the last 72 hours. CBG: No results for input(s): "GLUCAP" in the last 168 hours. Lipid Profile: No results for input(s): "CHOL", "HDL", "LDLCALC", "TRIG", "CHOLHDL", "LDLDIRECT" in the last 72 hours. Thyroid Function Tests: No results for input(s): "TSH", "T4TOTAL", "FREET4", "T3FREE", "THYROIDAB" in the last 72 hours. Anemia Panel: No results for input(s): "VITAMINB12", "FOLATE", "FERRITIN", "TIBC", "IRON", "RETICCTPCT" in the last 72 hours. Urine analysis: No results found for: "COLORURINE", "APPEARANCEUR", "LABSPEC", "PHURINE", "GLUCOSEU", "HGBUR", "BILIRUBINUR", "KETONESUR", "PROTEINUR", "UROBILINOGEN", "NITRITE", "LEUKOCYTESUR"  Radiological Exams on Admission: No results found.  EKG: na  Assessment/Plan Principal Problem:   Severe diarrhea    17 y/o w months dx of UC and has been trying to wean off of prednisone to humira without success but with reemergence of symptoms and Cdiff PCR positive  and no improvement with dificid this time do wonder if reemergence of UC symptoms.  I explained to family that they need to be careful because steroids can potentially mask c.diff symptoms  As per GI, thinking maybe flared UC and a false positive of the C.diff PCR especially since he had a negative EIA prior.  And timeline matches perhaps coming off the prednisone.   ---watch closely for C.diff flare on prednisone --will continue difid for now --per GI recc's will continue IV solumedrol 40mg  qd --will tentatively plan for [facility] tomorrow and NPO at midnight, pt understands to do this. --no immodium, consider peptobismol --hydration,60mL/hr LR til the aM --awaiting GIPP, not able to order a C.diff EIA, and will await GIPP before doing stool cultures since I think it checks the same things. --serial abd exams, --will hold mesalamine for now  --hold home PPI he wasn't taking anyway --they wonder if this constitutes a failure of humira and might escalate, I said to keep working with GI.  Hypokalemia - replace 2 x2, recheck in am Iron deficiency, folate and Vit D low in the past, Dr. Lavon Paganini wanted him to take a multivitamin Malnutrition w BMI <19, and vitamin/mineral deficiencies, encourage good nutrition, maybe consider MVI Anemia, ctm Platelets elevated, possibly reactive, ctm  Observation for now    Patient and/or Family completely agreed with the plan, expressed understanding and I answered all questions.  DVT prophylaxis: Heparin SQ Code Status: Full code Family Communication: talked with mother and father at bedside Disposition Plan: wonder if home if improvement with steroids and sigmoidoscopy is done to work things out in the outpatient setting Consults called: GI, Dr. Elnoria Howard but [place] tomorrow.   Admission status: observation for now  A total of 78 minutes utilized during this admission.  Charlane Ferretti DO [facility]   If 7PM-7AM, please contact  night-coverage [email or web address] Password [facility]  02/14/2023, 5:14 PM

## 2023-02-15 ENCOUNTER — Inpatient Hospital Stay (HOSPITAL_COMMUNITY): Payer: Medicaid Other | Admitting: Certified Registered"

## 2023-02-15 ENCOUNTER — Encounter (HOSPITAL_COMMUNITY)
Admission: EM | Disposition: A | Discharge status: Home / Self Care | Payer: Self-pay | Source: Home / Self Care | Attending: Internal Medicine

## 2023-02-15 ENCOUNTER — Encounter (HOSPITAL_COMMUNITY): Payer: Self-pay | Admitting: Internal Medicine

## 2023-02-15 DIAGNOSIS — Z1152 Encounter for screening for COVID-19: Secondary | ICD-10-CM | POA: Diagnosis not present

## 2023-02-15 DIAGNOSIS — E441 Mild protein-calorie malnutrition: Secondary | ICD-10-CM | POA: Diagnosis present

## 2023-02-15 DIAGNOSIS — K5100 Ulcerative (chronic) pancolitis without complications: Secondary | ICD-10-CM | POA: Diagnosis present

## 2023-02-15 DIAGNOSIS — K5190 Ulcerative colitis, unspecified, without complications: Secondary | ICD-10-CM | POA: Diagnosis present

## 2023-02-15 DIAGNOSIS — D649 Anemia, unspecified: Secondary | ICD-10-CM | POA: Diagnosis present

## 2023-02-15 DIAGNOSIS — K279 Peptic ulcer, site unspecified, unspecified as acute or chronic, without hemorrhage or perforation: Secondary | ICD-10-CM | POA: Diagnosis not present

## 2023-02-15 DIAGNOSIS — K51018 Ulcerative (chronic) pancolitis with other complication: Secondary | ICD-10-CM | POA: Diagnosis not present

## 2023-02-15 DIAGNOSIS — K529 Noninfective gastroenteritis and colitis, unspecified: Secondary | ICD-10-CM | POA: Diagnosis present

## 2023-02-15 DIAGNOSIS — E876 Hypokalemia: Secondary | ICD-10-CM | POA: Diagnosis present

## 2023-02-15 DIAGNOSIS — Z79620 Long term (current) use of immunosuppressive biologic: Secondary | ICD-10-CM | POA: Diagnosis not present

## 2023-02-15 DIAGNOSIS — Z68.41 Body mass index (BMI) pediatric, less than 5th percentile for age: Secondary | ICD-10-CM | POA: Diagnosis not present

## 2023-02-15 DIAGNOSIS — F419 Anxiety disorder, unspecified: Secondary | ICD-10-CM | POA: Diagnosis present

## 2023-02-15 HISTORY — PX: FLEXIBLE SIGMOIDOSCOPY: SHX5431

## 2023-02-15 HISTORY — PX: BIOPSY: SHX5522

## 2023-02-15 LAB — CBC
HCT: 40.5 % (ref 39.0–52.0)
Hemoglobin: 12.1 g/dL — ABNORMAL LOW (ref 13.0–17.0)
MCH: 23.9 pg — ABNORMAL LOW (ref 26.0–34.0)
MCHC: 29.9 g/dL — ABNORMAL LOW (ref 30.0–36.0)
MCV: 80 fL (ref 80.0–100.0)
Platelets: 424 10*3/uL — ABNORMAL HIGH (ref 150–400)
RBC: 5.06 MIL/uL (ref 4.22–5.81)
RDW: 15.2 % (ref 11.5–15.5)
WBC: 9.4 10*3/uL (ref 4.0–10.5)
nRBC: 0 % (ref 0.0–0.2)

## 2023-02-15 LAB — GASTROINTESTINAL PANEL BY PCR, STOOL (REPLACES STOOL CULTURE)

## 2023-02-15 LAB — BASIC METABOLIC PANEL
Anion gap: 7 (ref 5–15)
BUN: 20 mg/dL (ref 6–20)
CO2: 23 mmol/L (ref 22–32)
Calcium: 9 mg/dL (ref 8.9–10.3)
Chloride: 105 mmol/L (ref 98–111)
Creatinine, Ser: 0.62 mg/dL (ref 0.61–1.24)
GFR, Estimated: >60 mL/min (ref >60)
Glucose, Bld: 102 mg/dL — ABNORMAL HIGH (ref 70–99)
Potassium: 4.3 mmol/L (ref 3.5–5.1)
Sodium: 135 mmol/L (ref 135–145)

## 2023-02-15 LAB — HIV ANTIBODY (ROUTINE TESTING W REFLEX): HIV Screen 4th Generation wRfx: NONREACTIVE

## 2023-02-15 LAB — SARS CORONAVIRUS 2 BY RT PCR: SARS Coronavirus 2 by RT PCR: NEGATIVE

## 2023-02-15 SURGERY — SIGMOIDOSCOPY, FLEXIBLE
Anesthesia: Monitor Anesthesia Care

## 2023-02-15 MED ORDER — SODIUM CHLORIDE 0.9 % IV SOLN: INTRAVENOUS | Status: DC

## 2023-02-15 MED ORDER — LACTATED RINGERS IV SOLN: INTRAVENOUS | Status: DC

## 2023-02-15 MED ORDER — PROPOFOL 10 MG/ML IV BOLUS
INTRAVENOUS | Status: DC | PRN
  Administered 2023-02-15: 20 mg via INTRAVENOUS
  Administered 2023-02-15: 50 mg via INTRAVENOUS

## 2023-02-15 MED ORDER — PROPOFOL 500 MG/50ML IV EMUL
INTRAVENOUS | Status: DC | PRN
  Administered 2023-02-15: 200 ug/kg/min via INTRAVENOUS

## 2023-02-15 MED ORDER — FIDAXOMICIN 200 MG PO TABS
200.0000 mg | ORAL_TABLET | Freq: Two times a day (BID) | ORAL | Status: DC
  Administered 2023-02-15: 200 mg via ORAL
  Filled 2023-02-15 (×2): qty 1

## 2023-02-15 MED ORDER — LIDOCAINE HCL (CARDIAC) PF 100 MG/5ML IV SOSY
PREFILLED_SYRINGE | INTRAVENOUS | Status: DC | PRN
  Administered 2023-02-15: 20 mg via INTRAVENOUS
  Administered 2023-02-15: 40 mg via INTRAVENOUS

## 2023-02-15 NOTE — Progress Notes (Signed)
  Progress Note  Primary GI: Dr. 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  Patient states he has continued diffuse abdominal pain that has not changed since yesterday.  Continuing to have 20-30 bloody bowel movements per day.  Denies nausea/vomiting.   Objective   Vital signs in last 24 hours: Temp:  [97.7 F (36.5 C)-98.8 F (37.1 C)] 97.8 F (36.6 C) (06/17 0649) Pulse Rate:  [44-58] 51 (06/17 0649) Resp:  [14-17] 14 (06/17 0649) BP: (112-123)/(48-70) 117/67 (06/17 0649) SpO2:  [98 %-100 %] 100 % (06/17 0649) Weight:  [52.1 kg-52.2 kg] 52.1 kg (06/17 0433) Last BM Date : 02/14/23 Last BM recorded by nurses in past 5 days Stool Type: Type 7 (Liquid consistency with no solid pieces) (02/15/2023  3:00 AM)  General:   male in no acute distress  Heart:  Regular rate and rhythm; no murmurs Pulm: Clear anteriorly; no wheezing Abdomen: soft, nondistended, normal bowel sounds in all quadrants. Nontender without guarding. No organomegaly appreciated. Extremities:  No edema Neurologic:  Alert and  oriented x4;  No focal deficits.  Psych:  Cooperative. Normal mood and affect.  Intake/Output from previous day: 06/16 0701 - 06/17 0700 In: 1079.5 [P.O.:250; I.V.:829.5] Out: -  Intake/Output this shift: No intake/output data recorded.  Studies/Results: No results found.  Lab Results: Recent Labs    02/14/23 1320 02/15/23 0452  WBC 10.5 9.4  HGB 12.8* 12.1*  HCT 42.5 40.5  PLT 448* 424*   BMET Recent Labs    02/14/23 1320 02/15/23 0452  NA 136 135  K 3.4* 4.3  CL 103 105  CO2 26 23  GLUCOSE 95 102*  BUN 25* 20  CREATININE 0.85 0.62  CALCIUM 8.8* 9.0   LFT Recent Labs    02/14/23 1320  PROT 7.3  ALBUMIN 3.8  AST 14*  ALT 15  ALKPHOS 92  BILITOT 0.7   PT/INR No results for input(s): "LABPROT", "INR" in the last 72 hours.   Scheduled Meds:   fidaxomicin  200 mg Oral BID   heparin  5,000 Units Subcutaneous Q8H   methylPREDNISolone (SOLU-MEDROL) injection  40 mg Intravenous Q24H   sodium chloride flush  3 mL Intravenous Q12H   Continuous Infusions:    Patient profile:   17-year-old male past medical history of pan ulcerative colitis diagnosed at [facility] 07/15/2022.  Noted to have moderate inflammation throughout entire colon and biopsies showed severe chronic active colitis at that time.  Was treated with budesonide and Apriso  Transferred care to Dr. Nandigam  -Placed on Humira 3 months ago.  Adequate control of symptoms with combination of Humira and prednisone. When transitioning to monotherapy Humira he had recurrence of symptoms.  - having 20-30 Bms per day. C diff PCR positive 5/29, C diff EIA negative. Put on vanc without response. Started Dificid 6/10 without response and presented to ED. 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   Impression:   C diff (?) Pan UC -Fecal calprotectin 11/15/22 991 -Humira antibodies 5/29 low detection -GI pathogen panel pending  -CRP 0.6, sed rate 7 - WBC 9.4 - Platelets 424, likely reactive Flexible sigmoidoscopy warranted to evaluate possible C diff and/or UC activity. Will also rule out CMV with biopsies.   Plan:   Plan for Flexible sigmoidoscopy today. I thoroughly discussed the procedures to include nature, alternatives, benefits, and risks including but   not limited to bleeding, perforation, infection, anesthesia/cardiac and pulmonary complications. Patient provides understanding and gave verbal consent to proceed. Tap water enema prep Continue NPO Continue supportive care   Samary Shatz M Ziva Nunziata  02/15/2023, 9:10 AM    

## 2023-02-15 NOTE — Progress Notes (Addendum)
PROGRESS NOTE    Shawn Novak  ZOX:[id number] DOB: Aug 05, 2006 DOA: 02/14/2023 PCP: Silvano Rusk, MD  Outpatient Specialists:     Brief Narrative:  Patient is a 17 year old male with history of pan ulcerative colitis on Humira and mesalamine.  Patient was admitted with abdominal pain, cramps and bloody diarrhea.  There are also concerns for possible C. difficile colitis.  GI team is directing care.  Flexible sigmoidoscopy is planned for today.  Patient is currently on IV Solu-Medrol 40 Mg once daily and Dificid 200 Mg p.o. twice daily.  Patient may have tried oral vancomycin without significant improvement.  02/15/2023: Patient seen alongside patient's mother.  Patient continues to report frequent diarrhea.  Patient reports having about 20 diarrhea episodes.  The stools are bloody.  No fever or chills.  No nausea or vomiting.  Abdominal pain occurs when patient is having diarrhea.     Assessment & Plan:   Principal Problem:   Severe diarrhea   Ulcerative colitis flare: -Continue IV Solu-Medrol. -GI team is directing care. -For flexible sigmoidoscopy.  Possible C. difficile colitis: -He remains questionable if this is a false positive test. -Further testing as per the GI team. -Patient is currently on Dificid 200 Mg p.o. twice daily.  Hypokalemia: -Resolved. -Potassium is 4.3 today. -Continue to monitor closely.  Mild Malnutrition:  DVT prophylaxis: Subcutaneous heparin Code Status: Full code. Family Communication: Patient's mother. Disposition Plan: Home eventually.   Consultants:  GI.  Procedures:  Flexible sigmoidoscopy is planned.  Antimicrobials:  Dificid.   Subjective: Bloody, watery diarrhea. Abdominal pain with diarrhea.  Objective: Vitals:   02/14/23 2238 02/15/23 0327 02/15/23 0433 02/15/23 0649  BP: (!) 115/57 (!) 112/48  117/67  Pulse: (!) 50 (!) 44  (!) 51  Resp: 17 16  14   Temp: 97.7 F (36.5 C) 98.6 F (37 C)  97.8 F (36.6 C)   TempSrc: Oral Oral  Oral  SpO2: 99% 99%  100%  Weight:   52.1 kg   Height:        Intake/Output Summary (Last 24 hours) at 02/15/2023 1229 Last data filed at 02/15/2023 0920 Gross per 24 hour  Intake 1079.5 ml  Output --  Net 1079.5 ml   Filed Weights   02/14/23 1258 02/15/23 0433  Weight: 52.2 kg 52.1 kg    Examination:  General exam: Appears calm and comfortable  Respiratory system: Clear to auscultation.  Cardiovascular system: S1 & S2 heard, systolic murmur.   Gastrointestinal system: Abdomen is nondistended, soft and nontender.  Central nervous system: Alert and oriented.  Patient moves all extremities. Extremities: No leg edema.  Data Reviewed: I have personally reviewed following labs and imaging studies  CBC: Recent Labs  Lab 02/14/23 1320 02/15/23 0452  WBC 10.5 9.4  NEUTROABS 6.7  --   HGB 12.8* 12.1*  HCT 42.5 40.5  MCV 80.8 80.0  PLT 448* 424*   Basic Metabolic Panel: Recent Labs  Lab 02/14/23 1320 02/14/23 1941 02/15/23 0452  NA 136  --  135  K 3.4*  --  4.3  CL 103  --  105  CO2 26  --  23  GLUCOSE 95  --  102*  BUN 25*  --  20  CREATININE 0.85  --  0.62  CALCIUM 8.8*  --  9.0  MG 2.0  --   --   PHOS  --  3.6  --    GFR: Estimated Creatinine Clearance: 109.4 mL/min (by C-G formula based on SCr  of 0.62 mg/dL). Liver Function Tests: Recent Labs  Lab 02/14/23 1320  AST 14*  ALT 15  ALKPHOS 92  BILITOT 0.7  PROT 7.3  ALBUMIN 3.8   Recent Labs  Lab 02/14/23 1320  LIPASE 31   No results for input(s): "AMMONIA" in the last 168 hours. Coagulation Profile: No results for input(s): "INR", "PROTIME" in the last 168 hours. Cardiac Enzymes: No results for input(s): "CKTOTAL", "CKMB", "CKMBINDEX", "TROPONINI" in the last 168 hours. BNP (last 3 results) No results for input(s): "PROBNP" in the last 8760 hours. HbA1C: No results for input(s): "HGBA1C" in the last 72 hours. CBG: No results for input(s): "GLUCAP" in the last 168  hours. Lipid Profile: No results for input(s): "CHOL", "HDL", "LDLCALC", "TRIG", "CHOLHDL", "LDLDIRECT" in the last 72 hours. Thyroid Function Tests: Recent Labs    02/14/23 1941  TSH 1.038   Anemia Panel: No results for input(s): "VITAMINB12", "FOLATE", "FERRITIN", "TIBC", "IRON", "RETICCTPCT" in the last 72 hours. Urine analysis: No results found for: "COLORURINE", "APPEARANCEUR", "LABSPEC", "PHURINE", "GLUCOSEU", "HGBUR", "BILIRUBINUR", "KETONESUR", "PROTEINUR", "UROBILINOGEN", "NITRITE", "LEUKOCYTESUR" Sepsis Labs: @LABRCNTIP (procalcitonin:4,lacticidven:4)  ) Recent Results (from the past 240 hour(s))  Gastrointestinal Panel by PCR , Stool     Status: None   Collection Time: 02/14/23  3:42 PM   Specimen: Stool  Result Value Ref Range Status   Campylobacter species NOT DETECTED NOT DETECTED Final   Plesimonas shigelloides NOT DETECTED NOT DETECTED Final   Salmonella species NOT DETECTED NOT DETECTED Final   Yersinia enterocolitica NOT DETECTED NOT DETECTED Final   Vibrio species NOT DETECTED NOT DETECTED Final   Vibrio cholerae NOT DETECTED NOT DETECTED Final   Enteroaggregative E coli (EAEC) NOT DETECTED NOT DETECTED Final   Enteropathogenic E coli (EPEC) NOT DETECTED NOT DETECTED Final   Enterotoxigenic E coli (ETEC) NOT DETECTED NOT DETECTED Final   Shiga like toxin producing E coli (STEC) NOT DETECTED NOT DETECTED Final   Shigella/Enteroinvasive E coli (EIEC) NOT DETECTED NOT DETECTED Final   Cryptosporidium NOT DETECTED NOT DETECTED Final   Cyclospora cayetanensis NOT DETECTED NOT DETECTED Final   Entamoeba histolytica NOT DETECTED NOT DETECTED Final   Giardia lamblia NOT DETECTED NOT DETECTED Final   Adenovirus F40/41 NOT DETECTED NOT DETECTED Final   Astrovirus NOT DETECTED NOT DETECTED Final   Norovirus GI/GII NOT DETECTED NOT DETECTED Final   Rotavirus A NOT DETECTED NOT DETECTED Final   Sapovirus (I, II, IV, and V) NOT DETECTED NOT DETECTED Final    Comment:  Performed at [facility], [address] [place], [place] [address]  SARS Coronavirus 2 by RT PCR (hospital order, performed in [facility] hospital lab) *cepheid single result test* Anterior Nasal Swab     Status: None   Collection Time: 02/15/23  6:40 AM   Specimen: Anterior Nasal Swab  Result Value Ref Range Status   SARS Coronavirus 2 by RT PCR NEGATIVE NEGATIVE Final    Comment: (NOTE) SARS-CoV-2 target nucleic acids are NOT DETECTED.  The SARS-CoV-2 RNA is generally detectable in upper and lower respiratory specimens during the acute phase of infection. The lowest concentration of SARS-CoV-2 viral copies this assay can detect is 250 copies / mL. A negative result does not preclude SARS-CoV-2 infection and should not be used as the sole basis for treatment or other patient management decisions.  A negative result may occur with improper specimen collection / handling, submission of specimen other than nasopharyngeal swab, presence of viral mutation(s) within the areas targeted by this assay,  and inadequate number of viral copies (<250 copies / mL). A negative result must be combined with clinical observations, patient history, and epidemiological information.  Fact Sheet for Patients:   RoadLapTop.co.za  Fact Sheet for Healthcare Providers: http://kim-miller.com/  This test is not yet approved or  cleared by the [place] FDA and has been authorized for detection and/or diagnosis of SARS-CoV-2 by FDA under an Emergency Use Authorization (EUA).  This EUA will remain in effect (meaning this test can be used) for the duration of the COVID-19 declaration under Section 564(b)(1) of the Act, 21 U.S.C. section 360bbb-3(b)(1), unless the authorization is terminated or revoked sooner.  Performed at [facility], [address], [place], [place] [address]          Radiology Studies: No results  found.      Scheduled Meds:  fidaxomicin  200 mg Oral BID   heparin  5,000 Units Subcutaneous Q8H   methylPREDNISolone (SOLU-MEDROL) injection  40 mg Intravenous Q24H   sodium chloride flush  3 mL Intravenous Q12H   Continuous Infusions:   LOS: 0 days    Time spent: 35 minutes.    Berton Mount, MD  [facility] Pager #: [phone] 7PM-7AM contact night coverage as above  Addendum: Addendum done on 02/15/2023, at 5:22 PM.  Patient's father, Ryanlee Babar, asked me to call him.  Patient's father has asked for an inpatient allergy test to be done by an allergist.  According to the patient's father, he (the patient's father) informed me that someone told him that there was an allergist in this hospital.  The patient's father could not provide the name of the allergist given to him.  The patient's father could not tell me who may have given him such information.  I have contacted the operator and the emergency room and I get is that there is no allergists in this hospital.  I have also spoken to other providers available and everyone tells me there is no allergies.  I have sent a group email to [facility] seeking information about availability of an allergist in this hospital.  I have called the patient's further to update him.  The patient's father had mentioned that he was getting admitted with the chief of medicine tomorrow at 9 AM.  Hopefully, any information about availability of an allergist will be of great assistance.

## 2023-02-15 NOTE — Transfer of Care (Signed)
Immediate Anesthesia Transfer of Care Note  Patient: Shawn Novak  Procedure(s) Performed: FLEXIBLE SIGMOIDOSCOPY BIOPSY  Patient Location: PACU and Endoscopy Unit  Anesthesia Type:MAC  Level of Consciousness: awake and patient cooperative  Airway & Oxygen Therapy: Patient Spontanous Breathing  Post-op Assessment: Report given to RN and Post -op Vital signs reviewed and stable  Post vital signs: Reviewed and stable  Last Vitals:  Vitals Value Taken Time  BP 103/41 02/15/23 1503  Temp    Pulse 48 02/15/23 1505  Resp 22 02/15/23 1505  SpO2 100 % 02/15/23 1505  Vitals shown include unvalidated device data.  Last Pain:  Vitals:   02/15/23 1359  TempSrc: Temporal  PainSc: 7          Complications: No notable events documented.

## 2023-02-15 NOTE — H&P (View-Only) (Signed)
Progress Note  Primary GI: Dr. Lavon Paganini  LOS: 0 days   Chief Complaint: Pan ulcerative colitis and C. difficile   Subjective   No family was present at the time of my evaluation.  Patient states he has continued diffuse abdominal pain that has not changed since yesterday.  Continuing to have 20-30 bloody bowel movements per day.  Denies nausea/vomiting.   Objective   Vital signs in last 24 hours: Temp:  [97.7 F (36.5 C)-98.8 F (37.1 C)] 97.8 F (36.6 C) (06/17 0649) Pulse Rate:  [44-58] 51 (06/17 0649) Resp:  [14-17] 14 (06/17 0649) BP: (112-123)/(48-70) 117/67 (06/17 0649) SpO2:  [98 %-100 %] 100 % (06/17 0649) Weight:  [52.1 kg-52.2 kg] 52.1 kg (06/17 0433) Last BM Date : 02/14/23 Last BM recorded by nurses in past 5 days Stool Type: Type 7 (Liquid consistency with no solid pieces) (02/15/2023  3:00 AM)  General:   male in no acute distress  Heart:  Regular rate and rhythm; no murmurs Pulm: Clear anteriorly; no wheezing Abdomen: soft, nondistended, normal bowel sounds in all quadrants. Nontender without guarding. No organomegaly appreciated. Extremities:  No edema Neurologic:  Alert and  oriented x4;  No focal deficits.  Psych:  Cooperative. Normal mood and affect.  Intake/Output from previous day: 06/16 0701 - 06/17 0700 In: 1079.5 [P.O.:250; I.V.:829.5] Out: -  Intake/Output this shift: No intake/output data recorded.  Studies/Results: No results found.  Lab Results: Recent Labs    02/14/23 1320 02/15/23 0452  WBC 10.5 9.4  HGB 12.8* 12.1*  HCT 42.5 40.5  PLT 448* 424*   BMET Recent Labs    02/14/23 1320 02/15/23 0452  NA 136 135  K 3.4* 4.3  CL 103 105  CO2 26 23  GLUCOSE 95 102*  BUN 25* 20  CREATININE 0.85 0.62  CALCIUM 8.8* 9.0   LFT Recent Labs    02/14/23 1320  PROT 7.3  ALBUMIN 3.8  AST 14*  ALT 15  ALKPHOS 92  BILITOT 0.7   PT/INR No results for input(s): "LABPROT", "INR" in the last 72 hours.   Scheduled Meds:   fidaxomicin  200 mg Oral BID   heparin  5,000 Units Subcutaneous Q8H   methylPREDNISolone (SOLU-MEDROL) injection  40 mg Intravenous Q24H   sodium chloride flush  3 mL Intravenous Q12H   Continuous Infusions:    Patient profile:   17 year old male past medical history of pan ulcerative colitis diagnosed at [facility] 07/15/2022.  Noted to have moderate inflammation throughout entire colon and biopsies showed severe chronic active colitis at that time.  Was treated with budesonide and Apriso  Transferred care to Dr. Lavon Paganini  -Placed on Humira 3 months ago.  Adequate control of symptoms with combination of Humira and prednisone. When transitioning to monotherapy Humira he had recurrence of symptoms.  - having 20-30 Bms per day. C diff PCR positive 5/29, C diff EIA negative. Put on vanc without response. Started Dificid 6/10 without response and presented to ED. Patient works for [employer].   Impression:   C diff (?) Pan UC -Fecal calprotectin 11/15/22 991 -Humira antibodies 5/29 low detection -GI pathogen panel pending  -CRP 0.6, sed rate 7 - WBC 9.4 - Platelets 424, likely reactive Flexible sigmoidoscopy warranted to evaluate possible C diff and/or UC activity. Will also rule out CMV with biopsies.   Plan:   Plan for Flexible sigmoidoscopy today. I thoroughly discussed the procedures to include nature, alternatives, benefits, and risks including but  not limited to bleeding, perforation, infection, anesthesia/cardiac and pulmonary complications. Patient provides understanding and gave verbal consent to proceed. Tap water enema prep Continue NPO Continue supportive care   Oland Arquette Leanna Sato  02/15/2023, 9:10 AM

## 2023-02-15 NOTE — Anesthesia Preprocedure Evaluation (Addendum)
Anesthesia Evaluation  Patient identified by MRN, date of birth, ID band Patient awake    Reviewed: Allergy & Precautions, H&P , NPO status , Patient's Chart, lab work & pertinent test results  Airway Mallampati: I  TM Distance: >3 FB Neck ROM: Full    Dental no notable dental hx. (+) Teeth Intact, Dental Advisory Given   Pulmonary neg pulmonary ROS   Pulmonary exam normal breath sounds clear to auscultation       Cardiovascular Exercise Tolerance: Good negative cardio ROS Normal cardiovascular exam Rhythm:Regular Rate:Normal     Neuro/Psych negative neurological ROS  negative psych ROS   GI/Hepatic negative GI ROS, Neg liver ROS, PUD,,,  Endo/Other  negative endocrine ROS    Renal/GU negative Renal ROS  negative genitourinary   Musculoskeletal negative musculoskeletal ROS (+)    Abdominal   Peds negative pediatric ROS (+)  Hematology negative hematology ROS (+)   Anesthesia Other Findings Ulcerative colitis  Reproductive/Obstetrics negative OB ROS                             Anesthesia Physical Anesthesia Plan  ASA: 2  Anesthesia Plan: MAC   Post-op Pain Management: Minimal or no pain anticipated   Induction: Intravenous  PONV Risk Score and Plan: 2 and Propofol infusion  Airway Management Planned: Mask, Natural Airway and Simple Face Mask  Additional Equipment: None  Intra-op Plan:   Post-operative Plan:   Informed Consent: I have reviewed the patients History and Physical, chart, labs and discussed the procedure including the risks, benefits and alternatives for the proposed anesthesia with the patient or authorized representative who has indicated his/her understanding and acceptance.       Plan Discussed with: Anesthesiologist and CRNA  Anesthesia Plan Comments:         Anesthesia Quick Evaluation

## 2023-02-15 NOTE — Interval H&P Note (Signed)
History and Physical Interval Note:  02/15/2023 2:24 PM  Shawn Novak  has presented today for surgery, with the diagnosis of pan ulcerative colitis, bloody diarrhea, abdominal pain.  The various methods of treatment have been discussed with the patient and family. After consideration of risks, benefits and other options for treatment, the patient has consented to  Procedure(s): FLEXIBLE SIGMOIDOSCOPY (N/A) as a surgical intervention.  The patient's history has been reviewed, patient examined, no change in status, stable for surgery.  I have reviewed the patient's chart and labs.  Questions were answered to the patient's satisfaction.     Imogene Burn

## 2023-02-15 NOTE — Op Note (Signed)
[facility] Patient Name: Shawn Novak Procedure Date: 02/15/2023 MRN: [id number] Attending MD: Shawn Novak , , [id number] Date of Birth: 05/17/2006 CSN: [id number] Age: 17 Admit Type: Inpatient Procedure:                Flexible Sigmoidoscopy Indications:              Diarrhea Providers:                Madelyn Brunner" Sharion Settler, RN,                            Adolph Pollack, RN, Kandice Robinsons, Technician Referring MD:             Hospitalist team Medicines:                Monitored Anesthesia Care Complications:            No immediate complications. Estimated Blood Loss:     Estimated blood loss was minimal. Procedure:                Pre-Anesthesia Assessment:                           - Prior to the procedure, a History and Physical                            was performed, and patient medications and                            allergies were reviewed. The patient's tolerance of                            previous anesthesia was also reviewed. The risks                            and benefits of the procedure and the sedation                            options and risks were discussed with the patient.                            All questions were answered, and informed consent                            was obtained. Prior Anticoagulants: The patient has                            taken no anticoagulant or antiplatelet agents. ASA                            Grade Assessment: II - A patient with mild systemic                            disease. After reviewing the risks and benefits,  the patient was deemed in satisfactory condition to                            undergo the procedure.                           After obtaining informed consent, the scope was                            passed under direct vision. The [id number]-[id number] ([id number])                            Olympus endoscope was introduced through the anus                             and advanced to the the left transverse colon.                            Retroflexion was not able to performed in the                            rectum due to significant inflamamation in the                            colon. The flexible sigmoidoscopy was accomplished                            without difficulty. The patient tolerated the                            procedure well. Scope In: 2:44:20 PM Scope Out: 3:03:13 PM Total Procedure Duration: 0 hours 18 minutes 53 seconds  Findings:      Inflammation was found in a continuous and circumferential pattern. This       was graded as Mayo Score 3 (severe, with spontaneous bleeding,       ulcerations). Biopsies were taken with a cold forceps for histology. Impression:               - Severe (Mayo Score 3) pancolitis ulcerative                            colitis. Biopsied. Moderate Sedation:      Not Applicable - Patient had care per Anesthesia. Recommendation:           - Return patient to hospital ward for ongoing care.                           - Await pathology results.                           - Continue IV steroids for now. May consider                            Remicade therapy as an inpatient if patient does  not respond to IV steroids.                           - The findings and recommendations were discussed                            with the patient. Procedure Code(s):        --- Professional ---                           [phone], Sigmoidoscopy, flexible; with biopsy, single                            or multiple Diagnosis Code(s):        --- Professional ---                           K51.00, Ulcerative (chronic) pancolitis without                            complications                           R19.7, Diarrhea, unspecified CPT copyright 2022 American Medical Association. All rights reserved. The codes documented in this report are preliminary and upon coder review  may  be revised to meet current compliance requirements. Dr Shawn Novak "Shawn Novak" Shawn Novak,  02/15/2023 3:03:14 PM Number of Addenda: 0

## 2023-02-15 NOTE — TOC CM/SW Note (Signed)
Transition of Care [facility]) - Inpatient Brief Assessment   Patient Details  Name: DEAKEN GILLITZER MRN: [id number] Date of Birth: 2006-06-02  Transition of Care [facility]) CM/SW Contact:    Otelia Santee, LCSW Phone Number: 02/15/2023, 1:28 PM   Clinical Narrative: Chart reviewed. No TOC needs identified.    Transition of Care Asessment: Insurance and Status: Insurance coverage has been reviewed Patient has primary care physician: Yes Home environment has been reviewed: Home w/ family Prior level of function:: Independent Prior/Current Home Services: No current home services Social Determinants of Health Reivew: SDOH reviewed no interventions necessary Readmission risk has been reviewed: Yes Transition of care needs: no transition of care needs at this time

## 2023-02-15 NOTE — Anesthesia Postprocedure Evaluation (Signed)
Anesthesia Post Note  Patient: Shawn Novak  Procedure(s) Performed: FLEXIBLE SIGMOIDOSCOPY BIOPSY     Patient location during evaluation: PACU Anesthesia Type: MAC Level of consciousness: awake and alert Pain management: pain level controlled Vital Signs Assessment: post-procedure vital signs reviewed and stable Respiratory status: spontaneous breathing, nonlabored ventilation, respiratory function stable and patient connected to nasal cannula oxygen Cardiovascular status: stable and blood pressure returned to baseline Postop Assessment: no apparent nausea or vomiting Anesthetic complications: no   No notable events documented.  Last Vitals:  Vitals:   02/15/23 1359 02/15/23 1503  BP: 119/60 (!) 103/41  Pulse: (!) 55   Resp: 16   Temp: 36.8 C   SpO2: 100%     Last Pain:  Vitals:   02/15/23 1359  TempSrc: Temporal  PainSc: 7                  Jonaven Hilgers

## 2023-02-16 ENCOUNTER — Telehealth: Payer: Self-pay | Admitting: Gastroenterology

## 2023-02-16 DIAGNOSIS — K529 Noninfective gastroenteritis and colitis, unspecified: Secondary | ICD-10-CM | POA: Diagnosis not present

## 2023-02-16 DIAGNOSIS — K51018 Ulcerative (chronic) pancolitis with other complication: Secondary | ICD-10-CM

## 2023-02-16 LAB — MAGNESIUM: Magnesium: 2.1 mg/dL (ref 1.7–2.4)

## 2023-02-16 LAB — CBC WITH DIFFERENTIAL/PLATELET
Abs Immature Granulocytes: 0.03 10*3/uL (ref 0.00–0.07)
Basophils Absolute: 0.1 10*3/uL (ref 0.0–0.1)
Basophils Relative: 1 %
Eosinophils Absolute: 0.2 10*3/uL (ref 0.0–0.5)
Eosinophils Relative: 2 %
HCT: 38.4 % — ABNORMAL LOW (ref 39.0–52.0)
Hemoglobin: 11.8 g/dL — ABNORMAL LOW (ref 13.0–17.0)
Immature Granulocytes: 0 %
Lymphocytes Relative: 16 %
Lymphs Abs: 1.9 10*3/uL (ref 0.7–4.0)
MCH: 24.6 pg — ABNORMAL LOW (ref 26.0–34.0)
MCHC: 30.7 g/dL (ref 30.0–36.0)
MCV: 80.2 fL (ref 80.0–100.0)
Monocytes Absolute: 1.3 10*3/uL — ABNORMAL HIGH (ref 0.1–1.0)
Monocytes Relative: 11 %
Neutro Abs: 8.6 10*3/uL — ABNORMAL HIGH (ref 1.7–7.7)
Neutrophils Relative %: 70 %
Platelets: 422 10*3/uL — ABNORMAL HIGH (ref 150–400)
RBC: 4.79 MIL/uL (ref 4.22–5.81)
RDW: 15.2 % (ref 11.5–15.5)
WBC: 12.2 10*3/uL — ABNORMAL HIGH (ref 4.0–10.5)
nRBC: 0 % (ref 0.0–0.2)

## 2023-02-16 LAB — RENAL FUNCTION PANEL
Albumin: 3.2 g/dL — ABNORMAL LOW (ref 3.5–5.0)
Anion gap: 7 (ref 5–15)
BUN: 24 mg/dL — ABNORMAL HIGH (ref 6–20)
CO2: 25 mmol/L (ref 22–32)
Calcium: 8.5 mg/dL — ABNORMAL LOW (ref 8.9–10.3)
Chloride: 103 mmol/L (ref 98–111)
Creatinine, Ser: 0.86 mg/dL (ref 0.61–1.24)
GFR, Estimated: >60 mL/min (ref >60)
Glucose, Bld: 94 mg/dL (ref 70–99)
Phosphorus: 4.9 mg/dL — ABNORMAL HIGH (ref 2.5–4.6)
Potassium: 4.1 mmol/L (ref 3.5–5.1)
Sodium: 135 mmol/L (ref 135–145)

## 2023-02-16 LAB — C DIFFICILE QUICK SCREEN W PCR REFLEX
C Diff antigen: NEGATIVE
C Diff interpretation: NOT DETECTED
C Diff toxin: NEGATIVE

## 2023-02-16 MED ORDER — PREDNISONE 10 MG PO TABS: ORAL_TABLET | ORAL | 0 refills | Status: DC

## 2023-02-16 NOTE — Telephone Encounter (Signed)
He is currently admitted at [facility], inpatient GI team is following patient.  Will recommend follow-up and plan for treatment based on his response

## 2023-02-16 NOTE — Progress Notes (Addendum)
Progress Note  Primary GI: Dr. 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 Patient is in bathroom at the time of my interview.  He states his diarrhea is same and/or worse compared to yesterday.  RN states father requests to avoid Dificid due to cost   Objective   Vital signs in last 24 hours: Temp:  [97.3 F (36.3 C)-98.6 F (37 C)] 98.6 F (37 C) (06/18 0407) Pulse Rate:  [44-55] 48 (06/18 0407) Resp:  [10-23] 17 (06/18 0407) BP: (96-129)/(41-78) 112/59 (06/18 0407) SpO2:  [98 %-100 %] 99 % (06/18 0407) Weight:  [47.4 kg-52.1 kg] 47.4 kg (06/18 0553) Last BM Date : 02/15/23 Last BM recorded by nurses in past 5 days Stool Type: Type 7 (Liquid consistency with no solid pieces) (02/16/2023  5:58 AM)  General:   male in no acute distress  Neurologic:  Alert and  oriented x4;  No focal deficits.  Psych:  Cooperative. Normal mood and affect.  Intake/Output from previous day: 06/17 0701 - 06/18 0700 In: 400 [I.V.:400] Out: -  Intake/Output this shift: No intake/output data recorded.  Studies/Results: No results found.  Lab Results: Recent Labs    02/14/23 1320 02/15/23 0452 02/16/23 0515  WBC 10.5 9.4 12.2*  HGB 12.8* 12.1* 11.8*  HCT 42.5 40.5 38.4*  PLT 448* 424* 422*   BMET Recent Labs    02/14/23 1320 02/15/23 0452 02/16/23 0515  NA 136 135 135  K 3.4* 4.3 4.1  CL 103 105 103  CO2 26 23 25   GLUCOSE 95 102* 94  BUN 25* 20 24*  CREATININE 0.85 0.62 0.86  CALCIUM 8.8* 9.0 8.5*   LFT Recent Labs    02/14/23 1320 02/16/23 0515  PROT 7.3  --   ALBUMIN 3.8 3.2*  AST 14*  --   ALT 15  --   ALKPHOS 92  --   BILITOT 0.7  --    PT/INR No results for input(s): "LABPROT", "INR" in the last 72 hours.   Scheduled Meds:  fidaxomicin  200 mg Oral BID   heparin  5,000 Units Subcutaneous Q8H   methylPREDNISolone (SOLU-MEDROL) injection  40 mg  Intravenous Q24H   sodium chloride flush  3 mL Intravenous Q12H   Continuous Infusions:    Patient profile:   17 year old male past medical history of pan ulcerative colitis diagnosed at [facility] 07/15/2022.  Noted to have moderate inflammation throughout entire colon and biopsies showed severe chronic active colitis at that time.  Was treated with budesonide and Apriso   Transferred care to Dr. Lavon Paganini   -Placed on Humira 3 months ago.  Adequate control of symptoms with combination of Humira and prednisone. When transitioning to monotherapy Humira he had recurrence of symptoms.   - having 20-30 Bms per day. C diff PCR positive 5/29, C diff EIA negative. Put on vanc without response. Started Dificid 6/10 without response and presented to ED. 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  Biopsies pending    Plan:   - Await biopsy results - if path is negative  for CMV, will continue solu-medrol - Consider remicade if solumedrol is not effective while admitted. Also consider Rinvoq as an outpatient  - Continue supportive care - Ordered C diff screen to check C diff toxin - Will discuss with pharmacy team to ensure dificid can be covered/affordable.   Bayley Leanna Sato  02/16/2023, 8:56 AM     Attending Physician Note   I have taken an interval history, reviewed the chart and examined the patient. I performed a substantive portion of this encounter, including complete performance of at least one of the key components, in conjunction with the APP. I agree with the APP's note, impression and recommendations with my edits. My additional impressions and  recommendations are as follows.   Pan ulcerative colitis. C diff screen today was negative for toxin and Ag. Spoke with patient and his mother at the bedside. He is having 20+ urgent, loose BMs/day and is taking PO very well. He has minimal bleeding and minimal abdominal pain. CMV stain is pending.   Recommended continuing IV Solumedrol and reviewing CMV stain result prior to discharge however he would like to go home today after his Solumedrol dose this afternoon.  OK to discharge later today as requested  OK to discontinue Dificid Continue Humira as scheduled and consider changing to Rinvoq as outpatient Prednisone taper 50 mg qd x 5, 40 mg qd x 5, 30 mg qd x 5, then 20 mg qd until changed by Dr. Lavon Paganini Outpatient follow up with Dr. Lavon Paganini who was updated on his status today  Claudette Head, MD [facility] See [facility], [facility], for our on call provider

## 2023-02-16 NOTE — Discharge Instructions (Signed)
A referral has been made to the [facility] on your behalf. If you do not hear from the clinic to arrange for an appointment in the next 3-5 days, please call the number listed in the follow up section of this discharge packet.

## 2023-02-16 NOTE — Discharge Summary (Addendum)
DISCHARGE SUMMARY  Shawn Novak  MR#: [id number]  DOB:02-Oct-2005  Date of Admission: 02/14/2023 Date of Discharge: 02/16/2023  Attending Physician:Andromeda Poppen Silvestre Gunner, MD  Patient's NFA:[id number], Enzo Montgomery, MD  Consults: [facility]  Disposition: D/C home at request of patient   Follow-up Appts:  Follow-up Information     Napoleon Form, MD Follow up.   Specialty: Gastroenterology Why: Keep your previously arragned outpatient appointment, or call the office to schedule a visit sooner if you prefer. Contact information: [address] [place] [address] [phone]         [facility] at [facility] Follow up.   Specialty: Allergy and Asthma  Why: A referral has been made by the Hospitalists. If you do not hear from the clinic in 3-5 days, please call the clnic directly to inquire about an appointment. Contact information: [address] [facility] [place] [address] [phone]                Tests Needing Follow-up: -CMV stain for bx obtained during flex sig still pending at time of d/c home -determine if Humira to be changed to a different biologic agent in outpt f/u w/ his primary GI doctor  Discharge Diagnoses: Ulcerative pan colitis with an acute flare Possible refractory C. difficile colitis - ruled out  Hypokalemia  Initial presentation: 17 year old male with history of pan ulcerative colitis on Humira and mesalamine.  Patient was admitted with abdominal pain, cramps and bloody diarrhea.  There was concern for a possible refractory case of C. difficile colitis, as the patient had testing + for C diff as an outpatient and had been on tx per his GI doctor.  GI team is directing care.  Flexible sigmoidoscopy is planned for today.  Patient is currently on IV Solu-Medrol 40 Mg once daily and Dificid 200 Mg p.o. twice daily.  Patient may have tried oral vancomycin without significant improvement.   Hospital  Course: The patient was admitted to the acute units where he required supportive care for an acute flare of severe ulcerative pancolitis.  He had been on Humira mesalamine and steroids as an outpatient, and unfortunately when attempting to wean off his steroids he experienced a strong resurgence of increasing frequency of diarrhea and decreased oral intake.  He required potassium supplementation and IV fluid for general support.  GI was consulted to address his flare of ulcerative colitis.  It was noted that he had tested positive for C. difficile colitis in the outpatient setting, and had undergone a couple of courses of directed antibiotic treatment for this.  As a result it was felt to be most appropriate to empirically treat him for C. difficile initially during his hospital stay as well.  Fortunately C. difficile testing during his hospital stay returned negative confirming that his outpatient treatment course had been successful.  As a result no further directed antibiotic was necessary for C. difficile colitis.  Nonetheless his diarrhea persisted.  He was taken for a flexible sigmoidoscopy 02/15/2023 which confirmed active ulcerative pancolitis.  Biopsies were taken during the procedure and are currently pending to include CMV staining.  Biopsy results were not consistent with C. difficile, in agreement with negative C. difficile testing during this hospital stay.    By 02/16/2023 the patient reported that he was feeling much better overall, though he was continuing to have frequent bouts of watery diarrhea.  He insisted that he would recover better at home and requested  discharge home.  An extended family conference was had at the patient's bedside to include the patient, his mother, and his father.  I was present as a Wellsite geologist for [employer] as was the Brewing technologist for [employer] and the Oceanographer.  Multiple questions were answered regarding the patient's treatment  course and recommendations for future treatment.  All parties appeared to be confident at the end of the conversation that an appropriate course had been devised.  It was explained to the patient and his parents that the medical and GI team felt it would be in his best interest to remain in the hospital another night or 2 to continue IV Solu-Medrol and to monitor his symptoms.  Nonetheless the patient strongly desired discharge home and therefore at his request arrangements were made for outpatient follow-up and appropriate prescriptions were called in.  The plan is to continue Humira and prednisone for now with the patient to follow-up with his outpatient GI doctor to consider changing to a different monoclonal antibody.  The patient's family was also interested in allergy testing to determine if there was a GI allergy that could be contributing to his symptoms.  The patient and his family were advised that an outpatient allergy and immunology appointment would be the most appropriate means of addressing this, and an ambulatory referral was made to assist in arranging this.  Allergies as of 02/16/2023   No Known Allergies      Medication List     TAKE these medications    Humira (2 Pen) 40 MG/0.4ML Pnkt Generic drug: Adalimumab Inject 40 mg into the skin every 14 (fourteen) days. Start on day 29   mesalamine 0.375 g 24 hr capsule Commonly known as: Apriso Take 4 capsules (1.5 g total) by mouth daily.   predniSONE 10 MG tablet Commonly known as: DELTASONE 5 tablets (50mg ) every day for 5 days, then 4 tablets (40mg ) every day for 5 days, then 3 tablets (30mg ) every day for 5 days, then 2 tablets (20mg ) daily thereafter until otherwise instructed by Dr. Lavon Paganini Start taking on: February 17, 2023        Day of Discharge BP 118/71 (BP Location: Right Arm)   Pulse (!) 50   Temp 98.6 F (37 C)   Resp 18   Ht 5\' 6"  (1.676 m)   Wt 47.4 kg   SpO2 100%   BMI 16.85 kg/m   Physical Exam: No  acute respiratory distress. Alert and oriented. Pleasant and active. No evidence of GI distress or discomfort.   Basic Metabolic Panel: Recent Labs  Lab 02/14/23 1320 02/14/23 1941 02/15/23 0452 02/16/23 0515  NA 136  --  135 135  K 3.4*  --  4.3 4.1  CL 103  --  105 103  CO2 26  --  23 25  GLUCOSE 95  --  102* 94  BUN 25*  --  20 24*  CREATININE 0.85  --  0.62 0.86  CALCIUM 8.8*  --  9.0 8.5*  MG 2.0  --   --  2.1  PHOS  --  3.6  --  4.9*   CBC: Recent Labs  Lab 02/14/23 1320 02/15/23 0452 02/16/23 0515  WBC 10.5 9.4 12.2*  NEUTROABS 6.7  --  8.6*  HGB 12.8* 12.1* 11.8*  HCT 42.5 40.5 38.4*  MCV 80.8 80.0 80.2  PLT 448* 424* 422*    Time spent in discharge (includes decision making & examination of pt): 35  minutes  02/16/2023, 4:15 PM   Lonia Blood, MD [facility] Office  [phone]

## 2023-02-16 NOTE — Telephone Encounter (Signed)
Patient mom called in stated patient has been in the emergency room for 3 days now, he was prescribe 3 different antiobiotics but its not been working for him and they want to know what's going on ..want to speak with a nurse as soon as possible .

## 2023-02-17 ENCOUNTER — Telehealth: Payer: Self-pay | Admitting: Gastroenterology

## 2023-02-17 ENCOUNTER — Other Ambulatory Visit (HOSPITAL_COMMUNITY): Payer: Self-pay

## 2023-02-17 ENCOUNTER — Ambulatory Visit: Payer: Medicaid Other | Admitting: Gastroenterology

## 2023-02-17 ENCOUNTER — Encounter (HOSPITAL_COMMUNITY): Payer: Self-pay | Admitting: Internal Medicine

## 2023-02-17 LAB — SURGICAL PATHOLOGY

## 2023-02-17 NOTE — Telephone Encounter (Signed)
Inbound call from patient requesting a call back from a nurse , he said he has in the ED today and humira med didn't work for him..Please advise

## 2023-02-17 NOTE — Telephone Encounter (Signed)
Patient has tried and failed Humira. It may get a denial despite this information. A phone call would be needed to remind the insurance company that they are required by law to follow the [employer] formulary. Rinvoq is approved once Humira has been tried and failed.

## 2023-02-17 NOTE — Telephone Encounter (Signed)
Inbound call from patient stating Dr. Russella Dar released him from the hospital and suggested that start rinvoq medication. Requesting a call back regarding this. Please advise, thank you.

## 2023-02-18 ENCOUNTER — Telehealth: Payer: Self-pay

## 2023-02-18 ENCOUNTER — Other Ambulatory Visit: Payer: Self-pay

## 2023-02-18 MED ORDER — RINVOQ 45 MG PO TB24: 45.0000 mg | ORAL_TABLET | Freq: Every day | ORAL | 0 refills | Status: DC

## 2023-02-18 NOTE — Telephone Encounter (Signed)
Incoming call from [employer].  Tax ID, address, fax number, and diagnosis provided. Information will be sent to the clinical. PA started. Ref: [id number]

## 2023-02-18 NOTE — Telephone Encounter (Addendum)
Patient's father Remi Deter called regarding his sons condition and the fact that new medication has not yet been started. Father expresses concern that his son is having 20+ bm daily and had to go to the hospital. Also expresses concern that he has not been able to get a new IBD medication started yet. I explained the detailed process of getting insurance approval for IBD medications as well as the fact that we are working behind the scenes to get appropriate medications to him. Unfortunatley, this is not a fast process and requires [place] to to wait often days for insurance to respond. Advised that Rinvoq has been sent to the patient's pharmacy and can be paid for out of pocket if he does not want any further delay while awaiting insurance to decide on payment. Remi Deter verbalizes understanding of this information.

## 2023-02-18 NOTE — Telephone Encounter (Signed)
Dr Dorris Carnes-  Please see patient request for prednisone 10 mg. From what I can see, #90 tablets were previously sent in (taper would require 70 with additional tabs to remain on the medication).   Disp Refills Start End   predniSONE (DELTASONE) 10 MG tablet 90 tablet 0 02/17/2023    Sig: 5 tablets (50mg ) every day for 5 days, then 4 tablets (40mg ) every day for 5 days, then 3 tablets (30mg ) every day for 5 days, then 2 tablets (20mg ) daily thereafter until otherwise instructed by Dr. Glory Buff to pharmacy as: predniSONE (DELTASONE) 10 MG tablet   E-Prescribing Status: Receipt confirmed by pharmacy (02/16/2023  4:13 PM EDT)    Do you want me to send an additional 10 mg script?

## 2023-02-18 NOTE — Telephone Encounter (Signed)
He will need to continue the prednisone taper as recommended on discharge.  Await CMV on the biopsies, showed moderately active ulcerative colitis. Ulcerative colitis exacerbation/acute flare in the setting of C. difficile infection, no improvement despite treatment of C. difficile. Plan to switch to Rinvoq once approved by insurance, if patient is willing to pay out-of-pocket we can call in the prescription for Rinvoq if he does not wish to wait until it is approved by insurance.  Follow-up in office visit as scheduled in July.

## 2023-02-18 NOTE — Telephone Encounter (Signed)
I see the prescription for 10 mg prednisone 90 tabs sent to [facility] pharmacy, is more than adequate for him to continue taper dose until he comes back to see me in office on July 11.  He may actually have few tablets left over.  I will give him the prescription if he needs to continue it when he comes in for the follow-up visit

## 2023-02-18 NOTE — Telephone Encounter (Signed)
See additional information regarding this response under "patient message" 01/27/23

## 2023-02-19 ENCOUNTER — Encounter: Payer: Self-pay | Admitting: Internal Medicine

## 2023-02-19 NOTE — Telephone Encounter (Signed)
Patient was called and advised of an opening coming up but patient passed on it. Patient was put on wait list for next available appointment. Patient advised that the office prefers for him to be completed with his prednisone prior to his appointment if it is not a long term treatment plan for a diagnosis.

## 2023-02-22 ENCOUNTER — Telehealth: Payer: Self-pay

## 2023-02-22 ENCOUNTER — Other Ambulatory Visit: Payer: Self-pay

## 2023-02-22 MED ORDER — DICYCLOMINE HCL 20 MG PO TABS: 20.0000 mg | ORAL_TABLET | Freq: Three times a day (TID) | ORAL | 1 refills | Status: DC | PRN

## 2023-02-22 NOTE — Telephone Encounter (Signed)
It may be too early for the prednisone to kick in. Have him take 50 mg daily for 2 weeks, then decrease by 10 mg daily weekly. Prescribe Bentyl 20 mg; #60; 1 refill.  Take one 3 times daily as needed for abdominal pain Check stool for C. difficile, if not checked within the past month Get him follow-up with Dr. Lavon Paganini in a few weeks Thanks, Dr. Marina Goodell

## 2023-02-22 NOTE — Telephone Encounter (Signed)
Spoke with the patient. Reviewed the recommendations. Last C Difficile check was 02/16/23. Willing to try the Bentyl. Confirmed the pharmacy as [facility] on [address]. Agrees to continue the prednisone at 50 mg daily for 2 weeks. He does not agree to a taper by 10 mg a day.  Patient is scheduled for follow up with Dr Lavon Paganini. He states he is unavailable on 03/11/23. Patient explains he is available before and after the week of 03/07/23 to 03/12/23. We will try to get him a hospital follow up next week with an APP. Also expresses his disapproval that the 02/17/23 appointment was canceled "without my consent" and "I got out of the hospital on the 18th so I could come see her on the 19th and get everything straightened out."

## 2023-02-22 NOTE — Telephone Encounter (Signed)
DOD  Patient of Dr Lavon Paganini with UC previously hospitalized with UC flare after + C Diff.  Patient calls with concerns about his prednisone taper as order upon hospital discharge 02/16/23. Patient describes continued 20 to 25 bowel movements per day of liquid stool. Consistency and color varies from bloody/black watery diarrhea to "almost normal soft stool." Reports "agonizing" abdominal pain when on the toilet to have a bowel movement and periods of no abdominal pain. He does not have fever or nausea.  Patient was discharged with the following instructions for his prednisone taper.  predniSONE 10 MG tablet  5 tablets (50mg ) every day for 5 days, then 4 tablets (40mg ) every day for 5 days, then 3 tablets (30mg ) every day for 5 days, then 2 tablets (20mg ) daily thereafter until otherwise instructed by Dr. Lavon Paganini Start taking on: February 17, 2023  The patient wants to extend his taper. He has taken 50 mg today and has not tapered to 40 mg. He feels he does not have sufficient improvement in his symptoms to begin tapering. Fearful of ending up in the ER again.   (The patient has been approved for Rinvoq and shipment is being arranged with the specialty pharmacy.)  Patient is asking for a slower prednisone taper. Please advise.

## 2023-02-22 NOTE — Progress Notes (Signed)
Patient called requesting more Prednisone. He stated that he was afraid he was going to go into withdrawals because he didn't have enough pills. Patient stated that he has been tapering 5mg  per week. He also stated has a history of his body going through withdrawals if he takes this medication as it was prescribed at discharge.  Patient stated that he has discussed with GI doc in the past and GI is aware of how he needs to taper his medications. Recommendations were given to the patient to follow up with GI,PCP MD,  or return to ED if he wasn't feeling well. Patient has been discharge since 6/18 and would need to be reassessed by provider prior to prescribing additional medication. Patient agreed with this plan.

## 2023-02-23 ENCOUNTER — Other Ambulatory Visit: Payer: Self-pay

## 2023-02-23 MED ORDER — DICYCLOMINE HCL 20 MG PO TABS: 20.0000 mg | ORAL_TABLET | Freq: Three times a day (TID) | ORAL | 1 refills | Status: AC | PRN

## 2023-02-23 NOTE — Telephone Encounter (Signed)
Spoke with the patient. Advised of his hospital follow up appointment on 03/02/23 with Rito Ehrlich, PA. Advised of the cancellation of the 03/11/23 appointment with Dr Lavon Paganini. Explained to patient that he should discuss the concerns he has with the prescribed prednisone taper at this appointment.  Patient acknowledges this. Patient asks if he can see Dr Russella Dar also because he saw him in the hospital. Attempted to explain a little of the on-call, covering and hospital doctor protocols of this office. Explained that to see another GI within this practice would be considered a change of providers and has to be approved by both providers.

## 2023-02-25 ENCOUNTER — Encounter: Payer: Self-pay | Admitting: Allergy

## 2023-02-25 ENCOUNTER — Ambulatory Visit (INDEPENDENT_AMBULATORY_CARE_PROVIDER_SITE_OTHER): Payer: Medicaid Other | Admitting: Allergy

## 2023-02-25 ENCOUNTER — Other Ambulatory Visit: Payer: Self-pay

## 2023-02-25 VITALS — BP 108/62 | HR 60 | Temp 98.0°F | Resp 16 | Ht 65.75 in | Wt 112.8 lb

## 2023-02-25 DIAGNOSIS — T781XXA Other adverse food reactions, not elsewhere classified, initial encounter: Secondary | ICD-10-CM

## 2023-02-25 DIAGNOSIS — K51019 Ulcerative (chronic) pancolitis with unspecified complications: Secondary | ICD-10-CM | POA: Diagnosis not present

## 2023-02-25 DIAGNOSIS — Z713 Dietary counseling and surveillance: Secondary | ICD-10-CM | POA: Diagnosis not present

## 2023-02-25 DIAGNOSIS — T781XXD Other adverse food reactions, not elsewhere classified, subsequent encounter: Secondary | ICD-10-CM

## 2023-02-25 NOTE — Patient Instructions (Addendum)
You had no response to the positive histamine control - most likely due to prednisone use.  Unable to skin test today due to this.   Discussed with patient that skin prick testing and bloodwork (food IgE levels) check for IgE mediated reactions which his clinical presentation does not support.  Your diarrhea, abdominal pain and bloody stools are most likely due to uncontrolled ulcerative colitis.  We can order bloodwork for select food panel if you are interested.  Recommend that you follow up with your GI doctor. Consider following a low-fodmap diet - handout given.  Follow up as needed.

## 2023-02-25 NOTE — Progress Notes (Signed)
New Patient Note  RE: Shawn Novak MRN: [id number] DOB: 2006-01-25 Date of Office Visit: 02/25/2023  Consult requested by: Lonia Blood, MD Primary care provider: Silvano Rusk, MD  Chief Complaint: Allergy Testing  History of Present Illness: I had the pleasure of seeing Jasiel Swann for initial evaluation at the [facility] on 02/26/2023. He is a 17 y.o. male, who is referred here by Silvano Rusk, MD (hospitalist) for the evaluation of allergies.  Patient was diagnosed with ulcerative colitis in October 2023. He is on a very strict diet of boiled chicken, boiled egg, olive oil, sourdough bread since then. He is not sure if this diet made a difference as he still has GI symptoms. However, he noticed that some other foods make his GI symptoms worse. Patient wants to rule out food allergies that may be contributing to his current symptoms.   He was having diarrhea and blood stools which prompted a GI visit last year. Had colonoscopy and was diagnosed with UC. He has tried various medications including prednisone. He failed Humira.   Currently on prednisone 50mg  x 5 days. Still having bloody stools and diarrhea.   He was taking antibiotics for C-dif and was hospitalized for this. He had negative C-dif testing in the hospital.  Patient was discharged from the hospital on 02/16/2023.  Prior to October 2023 he was eating a varied diet with no issues. Denies any other associated symptoms.  Dietary History: patient has been eating other foods including milk, eggs, peanut, treenuts, sesame, shellfish, fish, soy, wheat, meats, fruits and vegetables.  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  Patient was admitted with abdominal pain, cramps and bloody diarrhea.  There was concern for a possible refractory case of C. difficile colitis, as the patient had testing + for  C diff as an outpatient and had been on tx per his GI doctor.  GI team is directing care.  Flexible sigmoidoscopy is planned for today.  Patient is currently on IV Solu-Medrol 40 Mg once daily and Dificid 200 Mg p.o. twice daily.  Patient may have tried oral vancomycin without significant improvement.    Hospital Course: The patient was admitted to the acute units where he required supportive care for an acute flare of severe ulcerative pancolitis.  He had been on Humira mesalamine and steroids as an outpatient, and unfortunately when attempting to wean off his steroids he experienced a strong resurgence of increasing frequency of diarrhea and decreased oral intake.  He required potassium supplementation and IV fluid for general support.  GI was consulted to address his flare of ulcerative colitis.  It was noted that he had tested positive for C. difficile colitis in the outpatient setting, and had undergone a couple of courses of directed antibiotic treatment for this.  As a result it was felt to be most appropriate to empirically treat him for C. difficile initially during his hospital stay as well.  Fortunately C. difficile testing during his hospital stay returned negative confirming that his outpatient treatment course had been successful.  As a result no further directed antibiotic was necessary for C. difficile colitis.  Nonetheless his diarrhea persisted.  He was taken for a flexible sigmoidoscopy 02/15/2023 which confirmed active ulcerative pancolitis.  Biopsies were taken during the procedure and are currently pending to include CMV staining.  Biopsy results were not consistent with C. difficile, in agreement with negative C.  difficile testing during this hospital stay.     By 02/16/2023 the patient reported that he was feeling much better overall, though he was continuing to have frequent bouts of watery diarrhea.  He insisted that he would recover better at home and requested discharge home.  An  extended family conference was had at the patient's bedside to include the patient, his mother, and his father.  I was present as a Wellsite geologist for [employer] as was the Brewing technologist for [employer] and the Oceanographer.  Multiple questions were answered regarding the patient's treatment course and recommendations for future treatment.  All parties appeared to be confident at the end of the conversation that an appropriate course had been devised.  It was explained to the patient and his parents that the medical and GI team felt it would be in his best interest to remain in the hospital another night or 2 to continue IV Solu-Medrol and to monitor his symptoms.  Nonetheless the patient strongly desired discharge home and therefore at his request arrangements were made for outpatient follow-up and appropriate prescriptions were called in.  The plan is to continue Humira and prednisone for now with the patient to follow-up with his outpatient GI doctor to consider changing to a different monoclonal antibody.  The patient's family was also interested in allergy testing to determine if there was a GI allergy that could be contributing to his symptoms.  The patient and his family were advised that an outpatient allergy and immunology appointment would be the most appropriate means of addressing this, and an ambulatory referral was made to assist in arranging this."  11/04/2022 GI visit: "17 year old very pleasant male with recent diagnosis of severe ulcerative colitis, pancolonic involvement with complaints of persistent rectal bleeding and increased bowel frequency and nocturnal symptoms concerning for active colitis   He is currently on Apriso 4 capsules daily Check stool C. difficile to exclude infection Fecal calprotectin and CRP to assess disease activity   IBD health maintenance: Follow-up CBC, iron panel, B12 and folate to exclude anemia and iron deficiency Check vitamin D  level and CMP   He is extremely reluctant to start immunosuppressive therapy, he is willing to try Rinvoq if approved by insurance.  Will request insurance approval He had stress fracture while on prednisone and also UTI, remains reluctant to take any form of steroid but is willing to try a short course of budesonide if needed but will try to avoid steroids   He has needle phobia and does not feel he can start Humira   His diet is extremely restricted and is only able to tolerate eggs and chicken at this point, continue with current dietary changes"  Assessment and Plan: Johnluke is a 18 y.o. male with: Dietary counseling and surveillance Diagnosed with ulcerative colitis in 2023 and since then having abdominal cramping, diarrhea and bloody stools. Limited his diet to chicken, egg, olive oil, sourdough bread as he was concerned that some other foods worsen his symptoms. However he is still having symptoms on the above diet and wants to rule out food allergies. Denies any other associated symptoms such as pruritus, rash, swelling. No prior history of food allergies. Taking prednisone daily to control UC. Discussed with patient at length that skin prick testing and bloodwork (food IgE levels) check for IgE mediated reactions which his clinical presentation does not support.  His GI symptoms are most likely due to his ulcerative colitis. Patient would still like  to undergo allergy food testing to rule things out. Patient had no response to the positive histamine control - most likely due to his chronic prednisone use. Unable to skin test today due to non-reactive positive control. Patient wants to pursue bloodwork then - ordered select food panel per patient request.  Recommend to closely follow up with GI.  Consider following a low-fodmap diet - handout given.  Return if symptoms worsen or fail to improve.  No orders of the defined types were placed in this encounter.  Lab Orders         Food  Allergy Profile         Chicken IgE      Other allergy screening: Asthma: no Rhino conjunctivitis: no Medication allergy: no Hymenoptera allergy: no Urticaria: no Eczema:no History of recurrent infections suggestive of immunodeficency: no  Diagnostics: Unable to skin test today as he did not reactive positive histamine control.  Past Medical History: Patient Active Problem List   Diagnosis Date Noted   Dietary counseling and surveillance 02/25/2023   Ulcerative colitis (HCC) 02/15/2023   Severe diarrhea 02/14/2023   Stress fracture of fibula 02/05/2022   Strain of calf muscle 01/15/2021   Past Medical History:  Diagnosis Date   Ulcerative colitis [facility])    Past Surgical History: Past Surgical History:  Procedure Laterality Date   BIOPSY  02/15/2023   Procedure: BIOPSY;  Surgeon: Imogene Burn, MD;  Location: [facility];  Service: Gastroenterology;;   [facility] SIGMOIDOSCOPY N/A 02/15/2023   Procedure: Arnell Sieving;  Surgeon: Imogene Burn, MD;  Location: [facility] ENDOSCOPY;  Service: Gastroenterology;  Laterality: N/A;   Medication List:  Current Outpatient Medications  Medication Sig Dispense Refill   dicyclomine (BENTYL) 20 MG tablet Take 1 tablet (20 mg total) by mouth 3 (three) times daily as needed for spasms. 60 tablet 1   mesalamine (APRISO) 0.375 g 24 hr capsule Take 4 capsules (1.5 g total) by mouth daily. 360 capsule 3   predniSONE (DELTASONE) 10 MG tablet 5 tablets (50mg ) every day for 5 days, then 4 tablets (40mg ) every day for 5 days, then 3 tablets (30mg ) every day for 5 days, then 2 tablets (20mg ) daily thereafter until otherwise instructed by Dr. Lavon Paganini 90 tablet 0   RINVOQ 45 MG TB24 Take 1 tablet (45 mg total) by mouth daily. Induction dose (Patient not taking: Reported on 02/25/2023) 56 tablet 0   No current facility-administered medications for this visit.   Allergies: No Known Allergies Social History: Social History   Socioeconomic  History   Marital status: Single    Spouse name: Not on file   Number of children: Not on file   Years of education: Not on file   Highest education level: Not on file  Occupational History   Not on file  Tobacco Use   Smoking status: Never   Smokeless tobacco: Never  Vaping Use   Vaping Use: Never used  Substance and Sexual Activity   Alcohol use: No   Drug use: No   Sexual activity: Not on file  Other Topics Concern   Not on file  Social History Narrative   Not on file   Social Determinants of Health   Financial Resource Strain: Not on file  Food Insecurity: No Food Insecurity (02/15/2023)   Hunger Vital Sign    Worried About Running Out of Food in the Last Year: Never true    Ran Out of Food in the Last Year: Never true  Transportation  Needs: No Transportation Needs (02/15/2023)   PRAPARE - Administrator, Civil Service (Medical): No    Lack of Transportation (Non-Medical): No  Physical Activity: Not on file  Stress: Not on file  Social Connections: Not on file   Lives in a house which is 76+ years old. Smoking: denies Occupation: Press photographer HistorySurveyor, minerals in the house: no Engineer, civil (consulting) in the family room: no Carpet in the bedroom: no Heating: electric Cooling:  fan Pet: yes 1 dog and 1 cat  Family History: Family History  Problem Relation Age of Onset   Allergic rhinitis Neg Hx    Angioedema Neg Hx    Asthma Neg Hx    Eczema Neg Hx    Urticaria Neg Hx    Review of Systems  Constitutional:  Positive for unexpected weight change (15lbs weight loss since October 2023). Negative for appetite change, chills and fever.  HENT:  Negative for congestion and rhinorrhea.   Eyes:  Negative for itching.  Respiratory:  Negative for cough, chest tightness, shortness of breath and wheezing.   Cardiovascular:  Negative for chest pain.  Gastrointestinal:  Positive for abdominal pain, blood in stool and diarrhea.  Genitourinary:  Negative  for difficulty urinating.  Skin:  Negative for rash.  Neurological:  Negative for headaches.    Objective: BP 108/62   Pulse 60   Temp 98 F (36.7 C)   Resp 16   Ht 5' 5.75" (1.67 m)   Wt 112 lb 12 oz (51.1 kg)   SpO2 97%   BMI 18.34 kg/m  Body mass index is 18.34 kg/m. Physical Exam Vitals and nursing note reviewed.  Constitutional:      Appearance: He is well-developed.  HENT:     Head: Normocephalic and atraumatic.     Right Ear: Tympanic membrane and external ear normal.     Left Ear: Tympanic membrane and external ear normal.     Nose: Nose normal.     Mouth/Throat:     Mouth: Mucous membranes are moist.     Pharynx: Oropharynx is clear.  Eyes:     Conjunctiva/sclera: Conjunctivae normal.  Cardiovascular:     Rate and Rhythm: Normal rate and regular rhythm.     Heart sounds: Normal heart sounds. No murmur heard.    No friction rub. No gallop.  Pulmonary:     Effort: Pulmonary effort is normal.     Breath sounds: Normal breath sounds. No wheezing, rhonchi or rales.  Musculoskeletal:     Cervical back: Neck supple.  Skin:    General: Skin is warm.     Findings: No rash.  Neurological:     Mental Status: He is alert and oriented to person, place, and time.  Psychiatric:        Behavior: Behavior normal.    The plan was reviewed with the patient/family, and all questions/concerned were addressed.  It was my pleasure to see Phineas today and participate in his care. Please feel free to contact me with any questions or concerns.  Sincerely,  Wyline Mood, DO Allergy & Immunology  [facility] office: [phone] [facility] office: [phone]

## 2023-02-26 ENCOUNTER — Telehealth: Payer: Self-pay

## 2023-02-26 ENCOUNTER — Encounter: Payer: Self-pay | Admitting: Allergy

## 2023-02-26 NOTE — Assessment & Plan Note (Signed)
Diagnosed with ulcerative colitis in 2023 and since then having abdominal cramping, diarrhea and bloody stools. Limited his diet to chicken, egg, olive oil, sourdough bread as he was concerned that some other foods worsen his symptoms. However he is still having symptoms on the above diet and wants to rule out food allergies. Denies any other associated symptoms such as pruritus, rash, swelling. No prior history of food allergies. Taking prednisone daily to control UC. Discussed with patient at length that skin prick testing and bloodwork (food IgE levels) check for IgE mediated reactions which his clinical presentation does not support.  His GI symptoms are most likely due to his ulcerative colitis. Patient would still like to undergo allergy food testing to rule things out. Patient had no response to the positive histamine control - most likely due to his chronic prednisone use. Unable to skin test today due to non-reactive positive control. Patient wants to pursue bloodwork then - ordered select food panel per patient request.  Recommend to closely follow up with GI.  Consider following a low-fodmap diet - handout given.

## 2023-02-26 NOTE — Telephone Encounter (Signed)
Patient message returned to the patient instructing to begin the Rinvoq and not to wait until he has completed the prednisone taper.

## 2023-02-26 NOTE — Telephone Encounter (Signed)
DOD Complicated patient of Dr Lavon Paganini with UC. Recent hospitalization acute flare of UC requiring IV solu-medrol and discharged on high dose oral prednisone taper. (See telephone contact 02/22/23) The patient will be starting Rinvoq. The shipment will arrive 03/01/23 per patient. I want to confirm that the patient does or does not need to complete the prednisone taper before beginning the induction dosing of Rinvoq. Thank you

## 2023-03-01 ENCOUNTER — Other Ambulatory Visit (HOSPITAL_COMMUNITY): Payer: Self-pay

## 2023-03-01 LAB — FOOD ALLERGY PROFILE
Allergen Corn, IgE: <0.1 kU/L
Clam IgE: <0.1 kU/L
Codfish IgE: <0.1 kU/L
Egg White IgE: <0.1 kU/L
Milk IgE: <0.1 kU/L
Peanut IgE: <0.1 kU/L
Scallop IgE: <0.1 kU/L
Sesame Seed IgE: <0.1 kU/L
Shrimp IgE: <0.1 kU/L
Soybean IgE: <0.1 kU/L
Walnut IgE: <0.1 kU/L
Wheat IgE: <0.1 kU/L

## 2023-03-01 LAB — ALLERGEN, CHICKEN F83: Chicken IgE: <0.1 kU/L

## 2023-03-02 ENCOUNTER — Encounter: Payer: Self-pay | Admitting: Gastroenterology

## 2023-03-02 ENCOUNTER — Ambulatory Visit (INDEPENDENT_AMBULATORY_CARE_PROVIDER_SITE_OTHER): Payer: Medicaid Other | Admitting: Gastroenterology

## 2023-03-02 VITALS — BP 100/50 | HR 60 | Ht 65.0 in | Wt 110.8 lb

## 2023-03-02 DIAGNOSIS — K51911 Ulcerative colitis, unspecified with rectal bleeding: Secondary | ICD-10-CM

## 2023-03-02 MED ORDER — PREDNISONE 10 MG PO TABS: ORAL_TABLET | ORAL | 0 refills | Status: AC

## 2023-03-02 NOTE — Progress Notes (Signed)
03/02/2023 Shawn Novak [id number] 2005-09-03   HISTORY OF PRESENT ILLNESS:  This is a 17 year old male with a PMH of pan UC that was diagnosed at [facility] on 07/15/2022. He was noted to have a moderate inflammation throughout his entire colon and the biopsies a severe chronic active colitis. He was treated with budesonide and Apriso. He subsequently transitioned his care to Dr. Lavon Paganini for persistent bloody diarrhea and abdominal pain. On average he has 20-30 diarrheal bowel movements/day Dr. Lavon Paganini discussed the options of treatment for him and he was advised to start on Humira, but he was reluctant as he has a phobia with needles. There was an inquiry about using Rinvoq, but this was denied as he did not fail an anti-TNF. After some time the patient did start on Humira and prednisone with good results. Prior to Humira he did use a couple of tapering doses of prednisone. He states that he was placed on Humira 3 months ago. Once he transitioned off of prednisone he started to experience a recurrence of his symptoms. With combination treatment he was able to achieve 2-3 formed bowel movements per day, but that remission state was transient. On Humira monotherapy he reports having 15-30 diarrheal bowel movements per day. He was checked for C diff using EIA and it was negative on 11/05/2022. Fecal calprotectin on that date was elevated at 991 as well as his CRP. He continued to have problems with diarrhea and on 01/27/2023 he was checked for C diff using the PCR and it was positive. He was started on vancomycin, but he did not respond. Approximately one week ago he was started on Dificid and he continued to have symptoms.  His Humira trough and antibody level were within acceptable limits.  He was hospitalized 02/14/2023 to 02/16/2023.  Flex sig 02/15/2023 showed severe pancolitis with biopsies as follows:  A. COLON, LEFT, BIOPSY:  Moderately active chronic colitis consistent with inflammatory bowel   disease.  Negative for dysplasia.  See comment.  Stain negative for CMV.  Subsequent follow-up C. difficile toxin and antigen on 02/16/2023 were negative so C. difficile treatment was discontinued.  After hospital discharge she was started on Rinvoq, which he has only been on for 2 days now.  He is still on prednisone 50 mg daily.  He does not think the prednisone is doing anything and he wants to start tapering and getting off of it, but he said that tapering too fast has caused a withdrawal in the past.  He is here today with his mom for hospital follow-up.  Says he still having 20-30 bowel movements a day with blood with each bowel movement and abdominal pain.  Is not using the dicyclomine as it does not seem to help.  He continues on Apriso 4 pills daily as well.  They are asking about that medication.  Past Medical History:  Diagnosis Date   Ulcerative colitis [facility])    Past Surgical History:  Procedure Laterality Date   BIOPSY  02/15/2023   Procedure: BIOPSY;  Surgeon: Imogene Burn, MD;  Location: [facility];  Service: Gastroenterology;;   Wenda Low SIGMOIDOSCOPY N/A 02/15/2023   Procedure: Arnell Sieving;  Surgeon: Imogene Burn, MD;  Location: [facility];  Service: Gastroenterology;  Laterality: N/A;    reports that he has never smoked. He has never used smokeless tobacco. He reports that he does not drink alcohol and does not use drugs. family history is not on file. No Known Allergies  Outpatient Encounter Medications as of 03/02/2023  Medication Sig   dicyclomine (BENTYL) 20 MG tablet Take 1 tablet (20 mg total) by mouth 3 (three) times daily as needed for spasms.   mesalamine (APRISO) 0.375 g 24 hr capsule Take 4 capsules (1.5 g total) by mouth daily.   predniSONE (DELTASONE) 10 MG tablet 5 tablets (50mg ) every day for 5 days, then 4 tablets (40mg ) every day for 5 days, then 3 tablets (30mg ) every day for 5 days, then 2 tablets (20mg ) daily thereafter until  otherwise instructed by Dr. Lavon Paganini (Patient taking differently: Take 50 mg by mouth daily. 5 tablets (50mg ) every day for 5 days, then 4 tablets (40mg ) every day for 5 days, then 3 tablets (30mg ) every day for 5 days, then 2 tablets (20mg ) daily thereafter until otherwise instructed by Dr. Lavon Paganini)   RINVOQ 45 MG TB24 Take 1 tablet (45 mg total) by mouth daily. Induction dose   No facility-administered encounter medications on file as of 03/02/2023.    REVIEW OF SYSTEMS  : All other systems reviewed and negative except where noted in the History of Present Illness.   PHYSICAL EXAM: BP (!) 100/50   Pulse 60   Ht 5\' 5"  (1.651 m)   Wt 110 lb 12.8 oz (50.3 kg)   BMI 18.44 kg/m  General: Well developed white male in no acute distress Head: Normocephalic and atraumatic Eyes:  Sclerae anicteric, conjunctiva pink. Ears: Normal auditory acuity Lungs: Clear throughout to auscultation; no W/R/R. Heart: Regular rate and rhythm; no M/R/G. Abdomen: Soft, non-distended.  BS present.  Mild diffuse TTP. Musculoskeletal: Symmetrical with no gross deformities  Skin: No lesions on visible extremities Extremities: No edema  Neurological: Alert oriented x 4, grossly non-focal Psychological:  Alert and cooperative. Normal mood and affect  ASSESSMENT AND PLAN: Pan UC -Fecal calprotectin 11/05/22 was 991 -Humira antibodies 5/29 low detection -GI pathogen panel negative -Flex sigmoidoscopy 6/17: Severe (Mayo score 3) pancolitis ulcerative colitis.  Biopsies show moderately active chronic colitis consistent with inflammatory bowel disease.  Stain negative for CMV. -Now on Rinvoq for only the past 2 days.  On prednisone 50 mg daily.  He does not think the prednisone is doing anything and he wants to start tapering and getting off of it, but he said that tapering too fast has caused a withdrawal in the past.  He would like to taper by 5 mg weekly.  Will send a prescription to his pharmacy for the remaining  taper.  Otherwise we need to give the Rinvoq time to work.  Will discuss with Dr. Lavon Paganini in regards to the benefits of continuing mesalamine as they did have some questions regarding that.  CC:  Silvano Rusk, MD

## 2023-03-02 NOTE — Patient Instructions (Signed)
We have sent the following medications to your pharmacy for you to pick up at your convenience: Prednisone taper  _______________________________________________________  If your blood pressure at your visit was 140/90 or greater, please contact your primary care physician to follow up on this.  _______________________________________________________  If you are age 19 or older, your body mass index should be between 23-30. Your Body mass index is 18.44 kg/m. If this is out of the aforementioned range listed, please consider follow up with your Primary Care Provider.  If you are age 79 or younger, your body mass index should be between 19-25. Your Body mass index is 18.44 kg/m. If this is out of the aformentioned range listed, please consider follow up with your Primary Care Provider.   ________________________________________________________  The  GI providers would like to encourage you to use [facility] to communicate with providers for non-urgent requests or questions.  Due to long hold times on the telephone, sending your provider a message by [facility] may be a faster and more efficient way to get a response.  Please allow 48 business hours for a response.  Please remember that this is for non-urgent requests.  _______________________________________________________

## 2023-03-09 NOTE — Progress Notes (Signed)
Given there is a quicker response to achieve clinical remission with Rinvoq based on clinical studies data, doing a quicker taper of steroid will decrease chronic prednisone exposure. Once he achieves clinical remission, can discontinue Apriso at the time.  Reviewed and agree with documentation and assessment and plan. Iona Beard , MD

## 2023-03-10 ENCOUNTER — Telehealth: Payer: Self-pay | Admitting: Gastroenterology

## 2023-03-10 NOTE — Telephone Encounter (Signed)
[employer], he is doing much better on Rinvoq induction dose, he has taken it for 10 days so far.  He is currently on prednisone 40 mg daily, remains reluctant to taper it down quickly, worried about rebound symptoms/withdrawal if he does a faster taper.    He continues to be argumentative and dictate medical management based on his Internet search ,including the dosage of medications, he wants to go on doing slower taper of prednisone by decreasing it 5 mg every 7 days, he was able to obtain the prescription through [facility].  Explained to him that as his physician my role is to prevent him from having any preventable side effects, decrease overall risks or causing any harm.  Given breakdown in communication, doctor-patient relationship, will need to end the relationship.  He will be discharged from the practice.  Advised him to establish with a new GI, in the interim I will provide him with refills for maintenance dose of Rinvoq 15 mg daily for additional 3 months to prevent recurrent flare or exacerbation of UC

## 2023-03-11 ENCOUNTER — Other Ambulatory Visit: Payer: Self-pay

## 2023-03-11 ENCOUNTER — Ambulatory Visit: Payer: Medicaid Other | Admitting: Gastroenterology

## 2023-03-11 MED ORDER — RINVOQ 15 MG PO TB24: 15.0000 mg | ORAL_TABLET | Freq: Every day | ORAL | 0 refills | Status: DC

## 2023-03-16 ENCOUNTER — Other Ambulatory Visit: Payer: Self-pay

## 2023-03-16 MED ORDER — RINVOQ 15 MG PO TB24: 15.0000 mg | ORAL_TABLET | Freq: Every day | ORAL | 2 refills | Status: AC

## 2023-03-16 NOTE — Progress Notes (Signed)
Patient's father walked in. He was requesting an explanation of why he and his son had been discharged from the practice.  I explained that he was discharged due to a loss of physician patient relationship.  He is advised that if he or his son needs to be seen we will provide urgent care at the emergency room for 30 days post discharge date of 03/11/23.  He is concerned that his son will run out of his Renvoq between now and finding an alternate GI MD to see him.  Per Dr. Elana Alm note on 03/10/23 she would provide him 3 months worth of refills on his Renvoq.  I sent the prescription to the [facility] pharmacy on [address] at his request. He is provided a copy of the discharge letter for the patient and himself.

## 2023-03-17 ENCOUNTER — Telehealth: Payer: Self-pay | Admitting: Gastroenterology

## 2023-03-17 NOTE — Telephone Encounter (Signed)
Spoke with the supervisor of nursing for [facility].  The patient is dismissed from the practice. He has received his letter. We are not obligated to intervene with the patient's insurance or pharmacy on his behalf. Message sent to the patient advising him.

## 2023-03-17 NOTE — Telephone Encounter (Signed)
Patient dismissed from [facility] and all providers practicing at this clinic. We believe that our patient/provider relationship has been compromised and thus would request that you seek care elsewhere 03/11/23

## 2023-03-17 NOTE — Telephone Encounter (Signed)
PT is calling concerning his Rinvoq. He has been dismissed from the practice and advised that Dr. Lavon Paganini has sent him 3 months of refills and they have been sent to [facility] Dr. Josefa Half not sure as to why he cannot pick them up but I was confused as to what he was asking of me. We have cover all the basics to release him.

## 2023-04-01 ENCOUNTER — Other Ambulatory Visit: Payer: Self-pay | Admitting: Gastroenterology

## 2023-04-06 ENCOUNTER — Ambulatory Visit: Payer: Medicaid Other | Admitting: Internal Medicine

## 2023-09-26 ENCOUNTER — Encounter (HOSPITAL_COMMUNITY): Payer: Self-pay

## 2023-09-26 ENCOUNTER — Ambulatory Visit (HOSPITAL_COMMUNITY)
Admission: EM | Admit: 2023-09-26 | Discharge: 2023-09-26 | Disposition: A | Discharge status: Home / Self Care | Payer: Medicaid Other | Attending: Family Medicine | Admitting: Family Medicine

## 2023-09-26 DIAGNOSIS — J069 Acute upper respiratory infection, unspecified: Secondary | ICD-10-CM | POA: Diagnosis not present

## 2023-09-26 LAB — POCT RAPID STREP A (OFFICE): Rapid Strep A Screen: NEGATIVE

## 2023-09-26 MED ORDER — BENZONATATE 100 MG PO CAPS: 100.0000 mg | ORAL_CAPSULE | Freq: Three times a day (TID) | ORAL | 0 refills | Status: DC

## 2023-09-26 MED ORDER — FLUTICASONE PROPIONATE 50 MCG/ACT NA SUSP: 2.0000 | Freq: Every day | NASAL | 2 refills | Status: AC

## 2023-09-26 NOTE — ED Triage Notes (Signed)
Patient reports that he has had a sore throat, fatigue, fever, and nasal congestion x 4 days.  Patient states he has been taking Tylenol and the last dose was at 0800 today.

## 2023-09-26 NOTE — ED Provider Notes (Signed)
[facility]    CSN: [id number] Arrival date & time: 09/26/23  1003      History   Chief Complaint Chief Complaint  Patient presents with   Sore Throat   Nasal Congestion   Fatigue   Fever    HPI Shawn Novak is a 18 y.o. male.   Pt complains of congestion, cough, fever, sore throat that started about 5 days ago.  He reports fever has resolved.  He has been taking Tylenol with relief.  Denies sick contacts.  He is unsure if he had his flu shot.   Denies shortness of breath or wheezing.  No h/o asthma or lung disease. He had Tylenol today around 8am.     Past Medical History:  Diagnosis Date   Ulcerative colitis [facility])     Patient Active Problem List   Diagnosis Date Noted   Dietary counseling and surveillance 02/25/2023   Ulcerative colitis (HCC) 02/15/2023   Severe diarrhea 02/14/2023   Stress fracture of fibula 02/05/2022   Strain of calf muscle 01/15/2021    Past Surgical History:  Procedure Laterality Date   BIOPSY  02/15/2023   Procedure: BIOPSY;  Surgeon: Imogene Burn, MD;  Location: [facility];  Service: Gastroenterology;;   Wenda Low SIGMOIDOSCOPY N/A 02/15/2023   Procedure: Arnell Sieving;  Surgeon: Imogene Burn, MD;  Location: [facility];  Service: Gastroenterology;  Laterality: N/A;       Home Medications    Prior to Admission medications   Medication Sig Start Date End Date Taking? Authorizing Provider  benzonatate (TESSALON) 100 MG capsule Take 1 capsule (100 mg total) by mouth every 8 (eight) hours. 09/26/23  Yes Ward, Tylene Fantasia, PA-C  fluticasone (FLONASE) 50 MCG/ACT nasal spray Place 2 sprays into both nostrils daily for 7 days. 09/26/23 10/03/23 Yes Ward, Tylene Fantasia, PA-C  dicyclomine (BENTYL) 20 MG tablet Take 1 tablet (20 mg total) by mouth 3 (three) times daily as needed for spasms. 02/23/23   Hilarie Fredrickson, MD  mesalamine (APRISO) 0.375 g 24 hr capsule Take 4 capsules (1.5 g total) by mouth daily. 11/04/22   Napoleon Form, MD  Upadacitinib ER (RINVOQ) 15 MG TB24 Take 1 tablet (15 mg total) by mouth daily. 03/16/23   Napoleon Form, MD    Family History Family History  Problem Relation Age of Onset   Ulcerative colitis Father    Allergic rhinitis Neg Hx    Angioedema Neg Hx    Asthma Neg Hx    Eczema Neg Hx    Urticaria Neg Hx     Social History Social History   Tobacco Use   Smoking status: Never   Smokeless tobacco: Never  Vaping Use   Vaping status: Never Used  Substance Use Topics   Alcohol use: No   Drug use: No     Allergies   Patient has no known allergies.   Review of Systems Review of Systems  Constitutional:  Positive for fever. Negative for chills.  HENT:  Positive for congestion and sore throat. Negative for ear pain.   Eyes:  Negative for pain and visual disturbance.  Respiratory:  Positive for cough. Negative for shortness of breath.   Cardiovascular:  Negative for chest pain and palpitations.  Gastrointestinal:  Negative for abdominal pain and vomiting.  Genitourinary:  Negative for dysuria and hematuria.  Musculoskeletal:  Negative for arthralgias and back pain.  Skin:  Negative for color change and rash.  Neurological:  Negative for  seizures and syncope.  All other systems reviewed and are negative.    Physical Exam Triage Vital Signs ED Triage Vitals [09/26/23 1014]  Encounter Vitals Group     BP 120/77     Systolic BP Percentile      Diastolic BP Percentile      Pulse Rate 71     Resp 14     Temp 98.8 F (37.1 C)     Temp Source Oral     SpO2 97 %     Weight      Height      Head Circumference      Peak Flow      Pain Score 6     Pain Loc      Pain Education      Exclude from Growth Chart    No data found.  Updated Vital Signs BP 120/77 (BP Location: Left Arm)   Pulse 71   Temp 98.8 F (37.1 C) (Oral)   Resp 14   SpO2 97%   Visual Acuity Right Eye Distance:   Left Eye Distance:   Bilateral Distance:    Right Eye  Near:   Left Eye Near:    Bilateral Near:     Physical Exam Vitals and nursing note reviewed.  Constitutional:      General: He is not in acute distress.    Appearance: He is well-developed.  HENT:     Head: Normocephalic and atraumatic.  Eyes:     Conjunctiva/sclera: Conjunctivae normal.  Cardiovascular:     Rate and Rhythm: Normal rate and regular rhythm.     Heart sounds: No murmur heard. Pulmonary:     Effort: Pulmonary effort is normal. No respiratory distress.     Breath sounds: Normal breath sounds.  Abdominal:     Palpations: Abdomen is soft.     Tenderness: There is no abdominal tenderness.  Musculoskeletal:        General: No swelling.     Cervical back: Neck supple.  Skin:    General: Skin is warm and dry.     Capillary Refill: Capillary refill takes less than 2 seconds.  Neurological:     Mental Status: He is alert.  Psychiatric:        Mood and Affect: Mood normal.      UC Treatments / Results  Labs (all labs ordered are listed, but only abnormal results are displayed) Labs Reviewed  POCT RAPID STREP A (OFFICE)    EKG   Radiology No results found.  Procedures Procedures (including critical care time)  Medications Ordered in UC Medications - No data to display  Initial Impression / Assessment and Plan / UC Course  I have reviewed the triage vital signs and the nursing notes.  Pertinent labs & imaging results that were available during my care of the patient were reviewed by me and considered in my medical decision making (see chart for details).     Patient with upper respiratory infection.  Overall well-appearing in no acute distress.  Lungs clear to auscultation.  Fevers have resolved.  Patient is out of the window for Tamiflu and Paxlovid will defer testing at this time given improvement of symptoms and mild symptoms overall.  He is okay to return to school or work tomorrow as he has been 24 hours without fever.  Continue supportive care  discussed.  Return precautions discussed. Final Clinical Impressions(s) / UC Diagnoses   Final diagnoses:  Viral upper respiratory tract infection  Discharge Instructions      The nurse ran the strep test which was negative.  You may have flu or COVID, but are out of the window for tamiflu or paxlovid.  These viruses typically run their course after 5-7 days and the treatment is supportive care. Recommendations are that you are ok to be around others with these illnesses after you are fever free for 24 hours.  Continue with supportive care Take Tylenol as needed Recommend Mucinex and Flonase for congestion and postnasal drip Recommend over the counter delsym cough syrup, may take tessalon as needed.  Drink plenty of fluids, rest.       ED Prescriptions     Medication Sig Dispense Auth. Provider   fluticasone (FLONASE) 50 MCG/ACT nasal spray Place 2 sprays into both nostrils daily for 7 days. 1 mL Ward, Tylene Fantasia, PA-C   benzonatate (TESSALON) 100 MG capsule Take 1 capsule (100 mg total) by mouth every 8 (eight) hours. 21 capsule Ward, Tylene Fantasia, PA-C      PDMP not reviewed this encounter.   Ward, Tylene Fantasia, PA-C 09/26/23 1047

## 2023-09-26 NOTE — Discharge Instructions (Addendum)
The nurse ran the strep test which was negative.  You may have flu or COVID, but are out of the window for tamiflu or paxlovid.  These viruses typically run their course after 5-7 days and the treatment is supportive care. Recommendations are that you are ok to be around others with these illnesses after you are fever free for 24 hours.  Continue with supportive care Take Tylenol as needed Recommend Mucinex and Flonase for congestion and postnasal drip Recommend over the counter delsym cough syrup, may take tessalon as needed.  Drink plenty of fluids, rest.

## 2023-10-18 ENCOUNTER — Ambulatory Visit (INDEPENDENT_AMBULATORY_CARE_PROVIDER_SITE_OTHER): Payer: Medicaid Other | Admitting: Family Medicine

## 2023-10-18 ENCOUNTER — Ambulatory Visit: Payer: Medicaid Other | Admitting: Family Medicine

## 2023-10-18 ENCOUNTER — Encounter: Payer: Self-pay | Admitting: Family Medicine

## 2023-10-18 ENCOUNTER — Ambulatory Visit
Admission: RE | Admit: 2023-10-18 | Discharge: 2023-10-18 | Disposition: A | Discharge status: Home / Self Care | Payer: Medicaid Other | Source: Ambulatory Visit | Attending: Family Medicine | Admitting: Family Medicine

## 2023-10-18 VITALS — BP 122/64 | Ht 65.0 in | Wt 120.0 lb

## 2023-10-18 DIAGNOSIS — M25551 Pain in right hip: Secondary | ICD-10-CM | POA: Diagnosis present

## 2023-10-18 DIAGNOSIS — K51019 Ulcerative (chronic) pancolitis with unspecified complications: Secondary | ICD-10-CM

## 2023-10-18 NOTE — Progress Notes (Signed)
 DATE OF VISIT: 10/18/2023        Shawn Novak DOB: 2006-01-20 MRN: [id number]  CC:  Rt hip pain  History- Shawn Novak is a 18 y.o.  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 ([id number] to [facility] [facility]) - was coming off injury - in January started feeling pain in the right anterior groin Stopped running Seen by school ATC  - dx with hip strain - has been doing dry needling - doing rehab program Still having pain Some pain with walking Worse with physical activity Worse with hip flexion Worse with stairs No night pain No meds for this  Dx with UC 05/2022 - hx chronic oral prednisone usage for almost a year, has been off prednisone x 5 months - on Rinvoq x 8 months - flare 06/2023 -- also had Cdiff at that time - lots joint issues in the past -- has done well with rehab exercises in the past  Hx of multiple stress fracture/stress reaction in the past: - Lt ankle - distal tibia & fibula on MRI 05/2022 - Rt ankle - right distal fibula 01/2022  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[place], [place]   Past Medical History Past Medical History:  Diagnosis Date   Ulcerative colitis [facility])     Past Surgical History Past Surgical History:  Procedure Laterality Date   BIOPSY  02/15/2023   Procedure: BIOPSY;  Surgeon: Imogene Burn, MD;  Location: [facility];  Service: Gastroenterology;;   Wenda Low SIGMOIDOSCOPY N/A 02/15/2023   Procedure: Arnell Sieving;  Surgeon: Imogene Burn, MD;  Location: [facility];  Service: Gastroenterology;  Laterality: N/A;    Medications Current Outpatient Medications  Medication Sig Dispense Refill   benzonatate (TESSALON) 100 MG capsule Take 1 capsule (100 mg total) by mouth every 8 (eight) hours. 21 capsule 0   dicyclomine (BENTYL) 20 MG tablet Take 1 tablet (20 mg total) by mouth 3 (three) times daily as needed for spasms. 60 tablet 1   fluticasone  (FLONASE) 50 MCG/ACT nasal spray Place 2 sprays into both nostrils daily for 7 days. 1 mL 2   mesalamine (APRISO) 0.375 g 24 hr capsule Take 4 capsules (1.5 g total) by mouth daily. 360 capsule 3   Upadacitinib ER (RINVOQ) 15 MG TB24 Take 1 tablet (15 mg total) by mouth daily. 30 tablet 2   No current facility-administered medications for this visit.    Allergies has no known allergies.  Family History - reviewed per EMR and intake form  Social History   reports no history of alcohol use.  reports that he has never smoked. He has never used smokeless tobacco.  reports no history of drug use. 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  Able to toe walk and heel walk.  No scoliosis Hips: Right hip without any gross deformity.  Does have faint ecchymosis along the right groin and area of prior dry needling.  No increased redness, no increased warmth.  Mild tenderness along the anterior hip over the AIIS, as well as right pubic  ramus.  No significant tenderness over the ASIS, greater trochanter, posterior hip.  Full range of motion of the right hip with pain at terminal flexion and with internal rotation.  Minimal pain with external rotation.  Positive FABER, positive FADIR.  Negative fulcrum test, negative hop test.  Hip strength 5 -/5 throughout. Left hip with full range of motion without pain, no anterior tenderness over the ASIS, AIIS, pubic ramus.  No tenderness over the greater trochanter or posterior hip.  Negative FADIR, negative FABER, negative fulcrum test, negative hop test.  Hip strength 5/5 throughout. No leg length difference Normal gait, walking without  a limp NEURO: sensation intact to light touch lower ext bilaterally VASC: pulses 2+ and symmetric DP/PT bilaterally, no edema   Assessment & Plan Hip pain, right Acute right anterior hip/groin pain and collegiate runner with past medical history significant for ulcerative colitis and prior history of chronic prednisone use, history of prior multiple stress fractures/stress reactions, minimal improvement over the last 6 weeks with rest, exercise program as directed by athletic trainers, and dry needling -DDx: Stress fracture/stress reaction of femoral neck or pubic ramus, avascular necrosis related to prior chronic prednisone use, groin strain, versus other etiology  Plan: -Imaging: Will obtain right hip/pelvic x-rays for MRI planning.  Rule out AVN or other abnormality -MRI right hip rule out stress fracture, stress reaction, AVN.  Patient is at high risk for bony stress injury and AVN given underlying history of UC, prior long-term prednisone use, and activity as an endurance athlete.  Plans to get this completed near school at [employer] near [place], [place] -He should continue to refrain from running and other impact exercise at this time.  Is okay to continue with nonimpact exercise such as stationary bike, elliptical, swimming as long as not worsening his symptoms.  Can continue for rehab program with athletic trainers -Can continue dry needling as needed -Heat or ice as needed -OTC Tylenol as needed, should avoid oral NSAIDs due to history of ulcerative colitis -Follow-up pending imaging results Ulcerative pancolitis with complication (HCC) History of ulcerative colitis and chronic prednisone use in the past, has been off of prednisone for the past 5 months -UC and prior prednisone use put him at high risk for AVN, as well as bony stress injury  Plan: -Continue treatment regimen for UC per GI and PCP  Patient expressed understanding & agreement with above.  Encounter  Diagnoses  Name Primary?   Hip pain, right Yes   Ulcerative pancolitis with complication (HCC)     Orders Placed This Encounter  Procedures   DG HIP UNILAT WITH PELVIS 2-3 VIEWS RIGHT   MR HIP RIGHT WO CONTRAST    Orders Placed This Encounter  Procedures   DG HIP UNILAT WITH PELVIS 2-3 VIEWS RIGHT   MR HIP RIGHT WO CONTRAST

## 2023-10-18 NOTE — Assessment & Plan Note (Signed)
 History of ulcerative colitis and chronic prednisone use in the past, has been off of prednisone for the past 5 months -UC and prior prednisone use put him at high risk for AVN, as well as bony stress injury  Plan: -Continue treatment regimen for UC per GI and PCP

## 2023-10-18 NOTE — Patient Instructions (Signed)
 Get xrays when you leave today. We'll send the MRI order to:  [facility] Address: [address] [place], [place] [address] Phone: [phone]

## 2023-10-19 ENCOUNTER — Ambulatory Visit: Payer: Medicaid Other | Admitting: Family Medicine

## 2023-11-04 ENCOUNTER — Encounter: Payer: Self-pay | Admitting: Family Medicine

## 2023-11-04 ENCOUNTER — Ambulatory Visit: Payer: Medicaid Other | Admitting: Sports Medicine

## 2023-11-04 DIAGNOSIS — S73191A Other sprain of right hip, initial encounter: Secondary | ICD-10-CM | POA: Insufficient documentation

## 2023-11-05 ENCOUNTER — Other Ambulatory Visit: Payer: Self-pay | Admitting: *Deleted

## 2023-11-05 DIAGNOSIS — M25551 Pain in right hip: Secondary | ICD-10-CM

## 2023-11-09 ENCOUNTER — Ambulatory Visit (INDEPENDENT_AMBULATORY_CARE_PROVIDER_SITE_OTHER): Admitting: Family Medicine

## 2023-11-09 ENCOUNTER — Encounter: Payer: Self-pay | Admitting: Family Medicine

## 2023-11-09 VITALS — BP 112/74 | Ht 65.0 in | Wt 120.0 lb

## 2023-11-09 DIAGNOSIS — S73191D Other sprain of right hip, subsequent encounter: Secondary | ICD-10-CM | POA: Diagnosis present

## 2023-11-09 NOTE — Assessment & Plan Note (Signed)
 Right anterior hip/groin pain for 3+ months with MRI showing labral tear.  Has not been improving with relative rest and dedicated rehab program with his school athletic trainer.  He has been unable to return to his running activities  Plan: -MRI findings discussed in detail.  Diagnosis and treatment reviewed -Discussed treatment options including: Conservative therapy with ongoing physical therapy, injection therapy, surgical intervention.  After discussing options patient would like to see a surgeon to discuss hip arthroscopy for his symptoms.  Referral to Dr. Aundria Rud at [facility] was placed.  He will reach out if he would like a referral to a physician closer to school in [facility]. -He will follow-up with [place] on an as-needed basis, encouraged to reach out with any questions or concerns after the surgery

## 2023-11-09 NOTE — Patient Instructions (Signed)
 We are going to refer you to Dr. Aundria Rud for your right hip pain. [facility] [address] [place] Ph: [phone]  If you don't hear from them within a week please either let us know or call them directly.

## 2023-11-09 NOTE — Progress Notes (Signed)
 DATE OF VISIT: 11/09/2023        Shawn Novak DOB: July 24, 2006 MRN: [id number]  CC: Follow-up right hip pain  History of present Illness: Shawn Novak is a 18 y.o. male who presents for a follow-up visit for right hip pain Last seen by me 10/18/2023.  There was concern for possible bony stress injury, MRI was ordered MRI completed 11/06/2023 showing labral tear, no other abnormalities Patient continues to have ongoing pain and has not been able to advance activities Has been doing regular rehab program with athletic trainers at school Patient considering surgical intervention to try to get back to activity, he is interested in discussing this today  Medications:  Outpatient Encounter Medications as of 11/09/2023  Medication Sig   benzonatate (TESSALON) 100 MG capsule Take 1 capsule (100 mg total) by mouth every 8 (eight) hours.   dicyclomine (BENTYL) 20 MG tablet Take 1 tablet (20 mg total) by mouth 3 (three) times daily as needed for spasms.   fluticasone (FLONASE) 50 MCG/ACT nasal spray Place 2 sprays into both nostrils daily for 7 days.   mesalamine (APRISO) 0.375 g 24 hr capsule Take 4 capsules (1.5 g total) by mouth daily.   Upadacitinib ER (RINVOQ) 15 MG TB24 Take 1 tablet (15 mg total) by mouth daily.   No facility-administered encounter medications on file as of 11/09/2023.    Allergies: has no known allergies.  Physical Examination: Vitals: BP 112/74   Ht 5\' 5"  (1.651 m)   Wt 120 lb (54.4 kg)   BMI 19.97 kg/m  GENERAL:  Shawn Novak is a 18 y.o. male appearing their stated age, alert and oriented x 3, in no apparent distress.  MSK: Walking without a limp, no acute abnormalities  Radiology: MRI:  MRI right hip without contrast 11/04/2023 showing: -Tear of the anterior superior right labrum at the chondral labral junction -No acute bony stress injury -No other acute bony abnormalities  Assessment & Plan Tear of right acetabular labrum, subsequent  encounter Right anterior hip/groin pain for 3+ months with MRI showing labral tear.  Has not been improving with relative rest and dedicated rehab program with his school athletic trainer.  He has been unable to return to his running activities  Plan: -MRI findings discussed in detail.  Diagnosis and treatment reviewed -Discussed treatment options including: Conservative therapy with ongoing physical therapy, injection therapy, surgical intervention.  After discussing options patient would like to see a surgeon to discuss hip arthroscopy for his symptoms.  Referral to Dr. Aundria Rud at [facility] was placed.  He will reach out if he would like a referral to a physician closer to school in [facility]. -He will follow-up with [place] on an as-needed basis, encouraged to reach out with any questions or concerns after the surgery Patient expressed understanding & agreement with above.  Encounter Diagnosis  Name Primary?   Tear of right acetabular labrum, subsequent encounter Yes    No orders of the defined types were placed in this encounter.

## 2023-11-12 ENCOUNTER — Other Ambulatory Visit (HOSPITAL_BASED_OUTPATIENT_CLINIC_OR_DEPARTMENT_OTHER): Payer: Self-pay

## 2023-11-12 ENCOUNTER — Ambulatory Visit (HOSPITAL_BASED_OUTPATIENT_CLINIC_OR_DEPARTMENT_OTHER): Admitting: Orthopaedic Surgery

## 2023-11-12 ENCOUNTER — Ambulatory Visit (HOSPITAL_BASED_OUTPATIENT_CLINIC_OR_DEPARTMENT_OTHER): Payer: Self-pay | Admitting: Orthopaedic Surgery

## 2023-11-12 DIAGNOSIS — M25859 Other specified joint disorders, unspecified hip: Secondary | ICD-10-CM

## 2023-11-12 MED ORDER — ASPIRIN 325 MG PO TBEC
325.0000 mg | DELAYED_RELEASE_TABLET | Freq: Every day | ORAL | 0 refills | Status: AC
  Filled 2023-11-12: qty 14, 14d supply, fill #0

## 2023-11-12 MED ORDER — ACETAMINOPHEN 500 MG PO TABS
500.0000 mg | ORAL_TABLET | Freq: Three times a day (TID) | ORAL | 0 refills | Status: AC
Start: 2023-11-12 — End: 2023-11-22
  Filled 2023-11-12: qty 30, 10d supply, fill #0

## 2023-11-12 MED ORDER — OXYCODONE HCL 5 MG PO TABS
5.0000 mg | ORAL_TABLET | ORAL | 0 refills | Status: AC | PRN
Start: 2023-11-12 — End: ?
  Filled 2023-11-12: qty 30, 5d supply, fill #0

## 2023-11-12 NOTE — Progress Notes (Signed)
 Chief Complaint: Right hip pain     History of Present Illness:    Shawn Novak is a 18 y.o. male presents today with ongoing pain in the right hip and groin.  This has been occurring now for the last 6 months.  This has been waxing and waning and is directly correlated with the amount of activity he has been doing in cross-country.  He is currently in college at [employer].  He has been working with dry needling and his athletic trainer in terms of his strengthening and rehab program which is not improving him at all.  Does have a history of ulcerative colitis.   PMH/PSH/Family History/Social History/Meds/Allergies:    Past Medical History:  Diagnosis Date   Ulcerative colitis [facility])    Past Surgical History:  Procedure Laterality Date   BIOPSY  02/15/2023   Procedure: BIOPSY;  Surgeon: Imogene Burn, MD;  Location: [facility];  Service: Gastroenterology;;   Wenda Low SIGMOIDOSCOPY N/A 02/15/2023   Procedure: Arnell Sieving;  Surgeon: Imogene Burn, MD;  Location: [facility];  Service: Gastroenterology;  Laterality: N/A;   Social History   Socioeconomic History   Marital status: Single    Spouse name: Not on file   Number of children: Not on file   Years of education: Not on file   Highest education level: Not on file  Occupational History   Not on file  Tobacco Use   Smoking status: Never   Smokeless tobacco: Never  Vaping Use   Vaping status: Never Used  Substance and Sexual Activity   Alcohol use: No   Drug use: No   Sexual activity: Not on file  Other Topics Concern   Not on file  Social History Narrative   Not on file   Social Drivers of Health   Financial Resource Strain: Not on file  Food Insecurity: No Food Insecurity (02/15/2023)   Hunger Vital Sign    Worried About Running Out of Food in the Last Year: Never true    Ran Out of Food in the Last Year: Never true  Transportation Needs: No Transportation Needs (02/15/2023)   PRAPARE -  Administrator, Civil Service (Medical): No    Lack of Transportation (Non-Medical): No  Physical Activity: Not on file  Stress: No Stress Concern Present (07/28/2023)   Received from [facility] - Occupational Stress Questionnaire    Feeling of Stress : Not at all  Social Connections: Unknown (04/05/2023)   Received from [facility]   Social Network    Social Network: Not on file   Family History  Problem Relation Age of Onset   Ulcerative colitis Father    Allergic rhinitis Neg Hx    Angioedema Neg Hx    Asthma Neg Hx    Eczema Neg Hx    Urticaria Neg Hx    No Known Allergies Current Outpatient Medications  Medication Sig Dispense Refill   acetaminophen (TYLENOL) 500 MG tablet Take 1 tablet (500 mg total) by mouth every 8 (eight) hours for 10 days. 30 tablet 0   aspirin EC 325 MG tablet Take 1 tablet (325 mg total) by mouth daily. 14 tablet 0   oxyCODONE (ROXICODONE) 5 MG immediate release tablet Take 1 tablet (5 mg total) by mouth every 4 (four) hours as needed for severe pain (pain score 7-10) or breakthrough pain. 30 tablet 0   benzonatate (TESSALON) 100 MG capsule Take  1 capsule (100 mg total) by mouth every 8 (eight) hours. 21 capsule 0   dicyclomine (BENTYL) 20 MG tablet Take 1 tablet (20 mg total) by mouth 3 (three) times daily as needed for spasms. 60 tablet 1   fluticasone (FLONASE) 50 MCG/ACT nasal spray Place 2 sprays into both nostrils daily for 7 days. 1 mL 2   mesalamine (APRISO) 0.375 g 24 hr capsule Take 4 capsules (1.5 g total) by mouth daily. 360 capsule 3   Upadacitinib ER (RINVOQ) 15 MG TB24 Take 1 tablet (15 mg total) by mouth daily. 30 tablet 2   No current facility-administered medications for this visit.   No results found.  Review of Systems:   A ROS was performed including pertinent positives and negatives as documented in the HPI.  Physical Exam :   Constitutional: NAD and appears stated  age Neurological: Alert and oriented Psych: Appropriate affect and cooperative There were no vitals taken for this visit.   Comprehensive Musculoskeletal Exam:    Positive FADIR with 30 degrees internal rotation of the hip in 90 degrees of flexion.  Negative FABER.  45 degrees of external rotation is without pain.  Distal neurosensory exam is intact   Imaging:   Xray (3 views right hip): Alpha angle measuring 65 degrees  MRI (right hip): Anterior superior labral tear   I personally reviewed and interpreted the radiographs.   Assessment and Plan:   18 y.o. 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  At this time he has trialed physical therapy as well as strengthening and activity restriction.  None of these have given him relief.  Given this I did discuss the possibility of right hip arthroscopy with labral repair and cam debridement.  I did discuss risks and limitations associated with this.  After discussion with both him and his mom he has elected for this  -Plan for right hip arthroscopy with cam debridement and labral repair   After a lengthy discussion of treatment options, including risks, benefits, alternatives, complications of surgical and nonsurgical conservative options, the patient elected surgical repair.   The patient  is aware of the material risks  and complications including, but not limited to injury to adjacent structures, neurovascular injury, infection, numbness, bleeding, implant failure, thermal burns, stiffness, persistent pain, failure to heal, disease transmission from allograft, need for further surgery, dislocation, anesthetic risks, blood clots, risks of death,and others. The probabilities of surgical success and failure discussed with patient given their particular co-morbidities.The time and nature of expected rehabilitation and recovery was discussed.The patient's questions were all answered preoperatively.  No  barriers to understanding were noted. I explained the natural history of the disease process and Rx rationale.  I explained to the patient what I considered to be reasonable expectations given their personal situation.  The final treatment plan was arrived at through a shared patient decision making process model.    I personally saw and evaluated the patient, and participated in the management and treatment plan.  Huel Cote, MD Attending Physician, Orthopedic Surgery  This document was dictated using Dragon voice recognition software. A reasonable attempt at proof reading has been made to minimize errors.

## 2023-11-18 ENCOUNTER — Ambulatory Visit (HOSPITAL_BASED_OUTPATIENT_CLINIC_OR_DEPARTMENT_OTHER): Admitting: Orthopaedic Surgery

## 2023-11-19 ENCOUNTER — Other Ambulatory Visit (HOSPITAL_BASED_OUTPATIENT_CLINIC_OR_DEPARTMENT_OTHER): Payer: Self-pay | Admitting: Orthopaedic Surgery

## 2023-11-19 DIAGNOSIS — M25859 Other specified joint disorders, unspecified hip: Secondary | ICD-10-CM

## 2023-12-09 ENCOUNTER — Encounter (HOSPITAL_BASED_OUTPATIENT_CLINIC_OR_DEPARTMENT_OTHER): Payer: Self-pay | Admitting: Orthopaedic Surgery

## 2023-12-09 ENCOUNTER — Other Ambulatory Visit: Payer: Self-pay

## 2023-12-16 ENCOUNTER — Other Ambulatory Visit (HOSPITAL_BASED_OUTPATIENT_CLINIC_OR_DEPARTMENT_OTHER): Payer: Self-pay

## 2023-12-20 ENCOUNTER — Ambulatory Visit (HOSPITAL_COMMUNITY)

## 2023-12-20 ENCOUNTER — Encounter (HOSPITAL_BASED_OUTPATIENT_CLINIC_OR_DEPARTMENT_OTHER): Payer: Self-pay | Admitting: Orthopaedic Surgery

## 2023-12-20 ENCOUNTER — Ambulatory Visit (HOSPITAL_BASED_OUTPATIENT_CLINIC_OR_DEPARTMENT_OTHER): Admitting: Anesthesiology

## 2023-12-20 ENCOUNTER — Encounter (HOSPITAL_BASED_OUTPATIENT_CLINIC_OR_DEPARTMENT_OTHER)
Admission: RE | Disposition: A | Discharge status: Home / Self Care | Payer: Self-pay | Source: Home / Self Care | Attending: Orthopaedic Surgery

## 2023-12-20 ENCOUNTER — Other Ambulatory Visit (HOSPITAL_BASED_OUTPATIENT_CLINIC_OR_DEPARTMENT_OTHER): Payer: Self-pay | Admitting: Orthopaedic Surgery

## 2023-12-20 ENCOUNTER — Other Ambulatory Visit: Payer: Self-pay

## 2023-12-20 ENCOUNTER — Ambulatory Visit (HOSPITAL_BASED_OUTPATIENT_CLINIC_OR_DEPARTMENT_OTHER)
Admission: RE | Admit: 2023-12-20 | Discharge: 2023-12-20 | Disposition: A | Discharge status: Home / Self Care | Attending: Orthopaedic Surgery | Admitting: Orthopaedic Surgery

## 2023-12-20 DIAGNOSIS — M25859 Other specified joint disorders, unspecified hip: Secondary | ICD-10-CM

## 2023-12-20 DIAGNOSIS — S73101A Unspecified sprain of right hip, initial encounter: Secondary | ICD-10-CM | POA: Insufficient documentation

## 2023-12-20 DIAGNOSIS — X58XXXA Exposure to other specified factors, initial encounter: Secondary | ICD-10-CM | POA: Diagnosis not present

## 2023-12-20 DIAGNOSIS — S73191A Other sprain of right hip, initial encounter: Secondary | ICD-10-CM

## 2023-12-20 DIAGNOSIS — M25851 Other specified joint disorders, right hip: Secondary | ICD-10-CM | POA: Diagnosis not present

## 2023-12-20 DIAGNOSIS — K5190 Ulcerative colitis, unspecified, without complications: Secondary | ICD-10-CM | POA: Insufficient documentation

## 2023-12-20 SURGERY — ARTHROSCOPY, HIP, WITH LABRUM REPAIR
Anesthesia: General | Site: Hip | Laterality: Right

## 2023-12-20 MED ORDER — SUGAMMADEX SODIUM 200 MG/2ML IV SOLN
INTRAVENOUS | Status: DC | PRN
Start: 2023-12-20 — End: 2023-12-20
  Administered 2023-12-20: 150 mg via INTRAVENOUS

## 2023-12-20 MED ORDER — GABAPENTIN 300 MG PO CAPS
300.0000 mg | ORAL_CAPSULE | Freq: Once | ORAL | Status: AC
  Administered 2023-12-20: 300 mg via ORAL

## 2023-12-20 MED ORDER — FENTANYL CITRATE (PF) 100 MCG/2ML IJ SOLN
INTRAMUSCULAR | Status: DC | PRN
  Administered 2023-12-20: 50 ug via INTRAVENOUS

## 2023-12-20 MED ORDER — ONDANSETRON HCL 4 MG/2ML IJ SOLN
INTRAMUSCULAR | Status: DC | PRN
  Administered 2023-12-20: 4 mg via INTRAVENOUS

## 2023-12-20 MED ORDER — ACETAMINOPHEN 500 MG PO TABS
1000.0000 mg | ORAL_TABLET | Freq: Once | ORAL | Status: AC
  Administered 2023-12-20: 1000 mg via ORAL

## 2023-12-20 MED ORDER — ATROPINE SULFATE 0.4 MG/ML IV SOLN
INTRAVENOUS | Status: AC
  Filled 2023-12-20: qty 1

## 2023-12-20 MED ORDER — PHENYLEPHRINE 80 MCG/ML (10ML) SYRINGE FOR IV PUSH (FOR BLOOD PRESSURE SUPPORT)
PREFILLED_SYRINGE | INTRAVENOUS | Status: AC
  Filled 2023-12-20: qty 20

## 2023-12-20 MED ORDER — DEXAMETHASONE SODIUM PHOSPHATE 10 MG/ML IJ SOLN
INTRAMUSCULAR | Status: AC
  Filled 2023-12-20: qty 3

## 2023-12-20 MED ORDER — OXYCODONE HCL 5 MG/5ML PO SOLN: 5.0000 mg | Freq: Once | ORAL | Status: DC | PRN

## 2023-12-20 MED ORDER — FENTANYL CITRATE (PF) 100 MCG/2ML IJ SOLN: 25.0000 ug | INTRAMUSCULAR | Status: DC | PRN

## 2023-12-20 MED ORDER — EPHEDRINE SULFATE (PRESSORS) 50 MG/ML IJ SOLN
INTRAMUSCULAR | Status: DC | PRN
  Administered 2023-12-20: 10 mg via INTRAVENOUS
  Administered 2023-12-20: 5 mg via INTRAVENOUS

## 2023-12-20 MED ORDER — DEXAMETHASONE SODIUM PHOSPHATE 4 MG/ML IJ SOLN
INTRAMUSCULAR | Status: DC | PRN
  Administered 2023-12-20: 10 mg via INTRAVENOUS

## 2023-12-20 MED ORDER — ROCURONIUM BROMIDE 10 MG/ML (PF) SYRINGE
PREFILLED_SYRINGE | INTRAVENOUS | Status: AC
  Filled 2023-12-20: qty 10

## 2023-12-20 MED ORDER — DEXMEDETOMIDINE HCL IN NACL 80 MCG/20ML IV SOLN
INTRAVENOUS | Status: DC | PRN
  Administered 2023-12-20: 4 ug via INTRAVENOUS

## 2023-12-20 MED ORDER — GABAPENTIN 300 MG PO CAPS
ORAL_CAPSULE | ORAL | Status: AC
  Filled 2023-12-20: qty 1

## 2023-12-20 MED ORDER — AMISULPRIDE (ANTIEMETIC) 5 MG/2ML IV SOLN: 10.0000 mg | Freq: Once | INTRAVENOUS | Status: DC | PRN

## 2023-12-20 MED ORDER — CEFAZOLIN SODIUM-DEXTROSE 2-4 GM/100ML-% IV SOLN
INTRAVENOUS | Status: AC
Start: 2023-12-20 — End: ?
  Filled 2023-12-20: qty 100

## 2023-12-20 MED ORDER — ONDANSETRON HCL 4 MG/2ML IJ SOLN
INTRAMUSCULAR | Status: AC
  Filled 2023-12-20: qty 4

## 2023-12-20 MED ORDER — ACETAMINOPHEN 500 MG PO TABS
ORAL_TABLET | ORAL | Status: AC
  Filled 2023-12-20: qty 2

## 2023-12-20 MED ORDER — BUPIVACAINE HCL 0.25 % IJ SOLN
INTRAMUSCULAR | Status: DC | PRN
  Administered 2023-12-20: 20 mL

## 2023-12-20 MED ORDER — TRANEXAMIC ACID-NACL 1000-0.7 MG/100ML-% IV SOLN
INTRAVENOUS | Status: AC
  Filled 2023-12-20: qty 100

## 2023-12-20 MED ORDER — LACTATED RINGERS IV SOLN: INTRAVENOUS | Status: DC

## 2023-12-20 MED ORDER — TRANEXAMIC ACID-NACL 1000-0.7 MG/100ML-% IV SOLN
1000.0000 mg | INTRAVENOUS | Status: AC
  Administered 2023-12-20: 1000 mg via INTRAVENOUS

## 2023-12-20 MED ORDER — FENTANYL CITRATE (PF) 100 MCG/2ML IJ SOLN
INTRAMUSCULAR | Status: AC
  Filled 2023-12-20: qty 2

## 2023-12-20 MED ORDER — BUPIVACAINE HCL (PF) 0.25 % IJ SOLN
INTRAMUSCULAR | Status: AC
  Filled 2023-12-20: qty 30

## 2023-12-20 MED ORDER — MIDAZOLAM HCL 2 MG/2ML IJ SOLN
INTRAMUSCULAR | Status: DC | PRN
  Administered 2023-12-20: 2 mg via INTRAVENOUS

## 2023-12-20 MED ORDER — OXYCODONE HCL 5 MG PO TABS: 5.0000 mg | ORAL_TABLET | Freq: Once | ORAL | Status: DC | PRN

## 2023-12-20 MED ORDER — ROCURONIUM BROMIDE 100 MG/10ML IV SOLN
INTRAVENOUS | Status: DC | PRN
Start: 2023-12-20 — End: 2023-12-20
  Administered 2023-12-20 (×2): 10 mg via INTRAVENOUS
  Administered 2023-12-20: 50 mg via INTRAVENOUS
  Administered 2023-12-20: 10 mg via INTRAVENOUS

## 2023-12-20 MED ORDER — SODIUM CHLORIDE 0.9 % IR SOLN
Status: DC | PRN
  Administered 2023-12-20: 12000 mL

## 2023-12-20 MED ORDER — PROPOFOL 10 MG/ML IV BOLUS
INTRAVENOUS | Status: DC | PRN
Start: 2023-12-20 — End: 2023-12-20
  Administered 2023-12-20: 200 mg via INTRAVENOUS

## 2023-12-20 MED ORDER — MIDAZOLAM HCL 2 MG/2ML IJ SOLN
INTRAMUSCULAR | Status: AC
  Filled 2023-12-20: qty 2

## 2023-12-20 MED ORDER — LIDOCAINE 2% (20 MG/ML) 5 ML SYRINGE
INTRAMUSCULAR | Status: AC
  Filled 2023-12-20: qty 15

## 2023-12-20 MED ORDER — LIDOCAINE HCL (CARDIAC) PF 100 MG/5ML IV SOSY
PREFILLED_SYRINGE | INTRAVENOUS | Status: DC | PRN
Start: 2023-12-20 — End: 2023-12-20
  Administered 2023-12-20: 60 mg via INTRAVENOUS

## 2023-12-20 MED ORDER — CEFAZOLIN SODIUM-DEXTROSE 2-4 GM/100ML-% IV SOLN
2.0000 g | INTRAVENOUS | Status: AC
  Administered 2023-12-20: 2 g via INTRAVENOUS

## 2023-12-20 SURGICAL SUPPLY — 57 items
ANCHOR SUT 1.4 FLEX (Anchor) IMPLANT
BIT DRILL FLEX NANOTACK (BIT) IMPLANT
BLADE SAMURAI STR FULL RADIUS (BLADE) IMPLANT
BLADE SURG 11 STRL SS (BLADE) ×2 IMPLANT
BUR ROUND HI FLUTE 8 4X19 (BURR) IMPLANT
CANISTER SUCT 1200ML W/VALVE (MISCELLANEOUS) ×2 IMPLANT
CANNULA OBTURATOR FLOWPORT ST5 (CANNULA) IMPLANT
CHLORAPREP W/TINT 26 (MISCELLANEOUS) ×2 IMPLANT
COOLER ICEMAN CLASSIC (MISCELLANEOUS) ×2 IMPLANT
COVER BACK TABLE 60X90IN (DRAPES) ×2 IMPLANT
COVER MAYO STAND STRL (DRAPES) ×4 IMPLANT
DERMABOND ADVANCED .7 DNX12 (GAUZE/BANDAGES/DRESSINGS) IMPLANT
DISSECTOR 4.2MMX19CM HL (MISCELLANEOUS) ×2 IMPLANT
DRAPE C-ARM 42X72 X-RAY (DRAPES) ×2 IMPLANT
DRAPE STERI IOBAN 125X83 (DRAPES) IMPLANT
DRAPE U-SHAPE 47X51 STRL (DRAPES) ×4 IMPLANT
DRSG TEGADERM 4X4.75 (GAUZE/BANDAGES/DRESSINGS) ×6 IMPLANT
FEE RENTAL EQUIP HIP INSTR KIT (INSTRUMENTS) IMPLANT
GAUZE PAD ABD 8X10 STRL (GAUZE/BANDAGES/DRESSINGS) IMPLANT
GAUZE SPONGE 4X4 12PLY STRL (GAUZE/BANDAGES/DRESSINGS) ×2 IMPLANT
GAUZE XEROFORM 1X8 LF (GAUZE/BANDAGES/DRESSINGS) ×2 IMPLANT
GLOVE BIO SURGEON STRL SZ 6 (GLOVE) ×4 IMPLANT
GLOVE BIO SURGEON STRL SZ7.5 (GLOVE) ×4 IMPLANT
GLOVE BIOGEL PI IND STRL 6.5 (GLOVE) ×2 IMPLANT
GLOVE BIOGEL PI IND STRL 8 (GLOVE) ×2 IMPLANT
GOWN STRL REUS W/ TWL LRG LVL3 (GOWN DISPOSABLE) ×4 IMPLANT
GOWN STRL REUS W/TWL XL LVL3 (GOWN DISPOSABLE) ×2 IMPLANT
INSTRUMENT ORTHO TEXT HIP FEM (INSTRUMENTS) IMPLANT
KIT PATIENT POSITION MEDIUM (KITS) IMPLANT
KIT PORTAL ENTRY HIP ACCESS (KITS) IMPLANT
MANIFOLD NEPTUNE II (INSTRUMENTS) ×2 IMPLANT
NDL HYPO 22X1.5 SAFETY MO (MISCELLANEOUS) IMPLANT
NDL INJECTOR II CARTRIDGE (MISCELLANEOUS) IMPLANT
NDL SPNL 18GX3.5 QUINCKE PK (NEEDLE) ×2 IMPLANT
NDL SUT 6 .5 CRC .975X.05 MAYO (NEEDLE) IMPLANT
NEEDLE HYPO 22X1.5 SAFETY MO (MISCELLANEOUS) IMPLANT
NEEDLE INJECTOR II CARTRIDGE (MISCELLANEOUS) ×1 IMPLANT
NEEDLE SPNL 18GX3.5 QUINCKE PK (NEEDLE) ×1 IMPLANT
PACK BASIN DAY SURGERY FS (CUSTOM PROCEDURE TRAY) ×2 IMPLANT
PAD COLD SHLDR WRAP-ON (PAD) ×2 IMPLANT
PASSER SUT 1.5D CRESCENT (INSTRUMENTS) IMPLANT
SPIKE FLUID TRANSFER (MISCELLANEOUS) IMPLANT
SPONGE T-LAP 18X18 ~~LOC~~+RFID (SPONGE) IMPLANT
SUCTION TUBE FRAZIER 10FR DISP (SUCTIONS) IMPLANT
SUT ETHILON 3 0 PS 1 (SUTURE) ×2 IMPLANT
SUT VIC AB 0 CT1 27XBRD ANBCTR (SUTURE) IMPLANT
SUT VIC AB 2-0 CT1 TAPERPNT 27 (SUTURE) IMPLANT
SUT XBRAID 1.4 WHITE/BLUE (SUTURE) IMPLANT
SUTURE FIBERWR #2 38 T-5 BLUE (SUTURE) IMPLANT
SYR 20ML LL LF (SYRINGE) IMPLANT
SYR 50ML LL SCALE MARK (SYRINGE) ×2 IMPLANT
TOWEL GREEN STERILE FF (TOWEL DISPOSABLE) ×4 IMPLANT
TRAY ARTHROSCOPY HIP STRE FEE (INSTRUMENTS) ×1 IMPLANT
TRAY PIVOT PORT STRE FEE (INSTRUMENTS) ×1 IMPLANT
TUBE CONNECTING 20X1/4 (TUBING) ×4 IMPLANT
TUBING ARTHROSCOPY IRRIG 16FT (MISCELLANEOUS) ×2 IMPLANT
WAND APOLLO RF 50D ABLATOR (BUR) ×2 IMPLANT

## 2023-12-20 NOTE — Anesthesia Procedure Notes (Signed)
 Procedure Name: Intubation Date/Time: 12/20/2023 12:34 PM  Performed by: Lonia Ro, CRNAPre-anesthesia Checklist: Patient identified, Emergency Drugs available, Suction available, Patient being monitored and Timeout performed Patient Re-evaluated:Patient Re-evaluated prior to induction Oxygen Delivery Method: Circle system utilized Preoxygenation: Pre-oxygenation with 100% oxygen Induction Type: IV induction Ventilation: Mask ventilation without difficulty Laryngoscope Size: Mac and 3 Grade View: Grade I Tube type: Oral Tube size: 7.0 mm Number of attempts: 1 Airway Equipment and Method: Stylet Placement Confirmation: ETT inserted through vocal cords under direct vision, positive ETCO2 and breath sounds checked- equal and bilateral Secured at: 22 cm Tube secured with: Tape Dental Injury: Teeth and Oropharynx as per pre-operative assessment

## 2023-12-20 NOTE — Brief Op Note (Signed)
   Brief Op Note  Date of Surgery: 12/20/2023  Preoperative Diagnosis: right hip impingement  Postoperative Diagnosis: same  Procedure: Procedure(s): ARTHROSCOPY, HIP, WITH LABRUM REPAIR  Implants: Implant Name Type Inv. Item Serial No. Manufacturer Lot No. LRB No. Used Action  ANCHOR SUT 1.4 FLEX - [id number] Anchor ANCHOR SUT 1.4 FLEX  STRYKER ENDOSCOPY [id number] Right 2 Implanted    Surgeons: Surgeon(s): Wilhelmenia Harada, MD  Anesthesia: General    Estimated Blood Loss: See anesthesia record  Complications: None  Condition to PACU: Stable  Carmina Chris, MD 12/20/2023 2:45 PM

## 2023-12-20 NOTE — Anesthesia Preprocedure Evaluation (Addendum)
 Anesthesia Evaluation  Patient identified by MRN, date of birth, ID band Patient awake    Reviewed: Allergy  & Precautions, NPO status , Patient's Chart, lab work & pertinent test results  Airway Mallampati: I  TM Distance: >3 FB Neck ROM: Full    Dental  (+) Teeth Intact, Dental Advisory Given   Pulmonary    Pulmonary exam normal breath sounds clear to auscultation       Cardiovascular Normal cardiovascular exam Rhythm:Regular Rate:Normal     Neuro/Psych  negative psych ROS   GI/Hepatic PUD,,,Ulcerative colitis   Endo/Other    Renal/GU   negative genitourinary   Musculoskeletal negative musculoskeletal ROS (+)    Abdominal   Peds  Hematology   Anesthesia Other Findings   Reproductive/Obstetrics negative OB ROS                             Anesthesia Physical Anesthesia Plan  ASA: 1  Anesthesia Plan: General   Post-op Pain Management: Tylenol  PO (pre-op)*   Induction: Intravenous  PONV Risk Score and Plan: 2 and Ondansetron , Dexamethasone , Treatment may vary due to age or medical condition and Midazolam   Airway Management Planned: Oral ETT  Additional Equipment:   Intra-op Plan:   Post-operative Plan: Extubation in OR  Informed Consent:      Dental advisory given  Plan Discussed with: CRNA and Anesthesiologist  Anesthesia Plan Comments: (Risks of general anesthesia discussed including, but not limited to, sore throat, hoarse voice, chipped/damaged teeth, injury to vocal cords, nausea and vomiting, allergic reactions, lung infection, heart attack, stroke, and death. All questions answered. )        Anesthesia Quick Evaluation

## 2023-12-20 NOTE — Op Note (Signed)
 Date of Surgery: 12/20/2023  INDICATIONS: Mr. Shawn Novak is a 18 y.o.-year-old male with right hip labral tearing in the setting of CAM impingment.  The risk and benefits of the procedure were discussed in detail and documented in the pre-operative evaluation.   PREOPERATIVE DIAGNOSIS: 1. Right hip labral tear 2. Right hip CAM lesion  POSTOPERATIVE DIAGNOSIS: Same.  PROCEDURE: 1. Right hip labral repair 2. Right hip CAM debridement  SURGEON: Carmina Chris MD  ASSISTANT: Deon Flatter, ATC  ANESTHESIA:  general  IV FLUIDS AND URINE: See anesthesia record.  ANTIBIOTICS: Ancef   ESTIMATED BLOOD LOSS: 5 mL.  IMPLANTS:  Implant Name Type Inv. Item Serial No. Manufacturer Lot No. LRB No. Used Action  ANCHOR SUT 1.4 FLEX - [id number] Anchor ANCHOR SUT 1.4 FLEX  [employer] [id number] Right 2 Implanted    DRAINS: None  CULTURES: None  COMPLICATIONS: none  DESCRIPTION OF PROCEDURE:   Cartilage Intact femoral and acetabular cartilage   Labrum Frayed, torn with chondral labral junctional irritation appearing   Boundaries of labral tear Convention (3 o'clock anterior, 9 o'clock posterior) Anterior boundary: 3 o'clock Posterior boundary: 1 o'clock   OPERATIVE REPORT:  The patient was brought to the operating room, placed supine on the operating table, and bony prominences were padded.  The traction boots were applied with padding to ensure that safe traction could be applied through the feet.  The contralateral limb was abducted maximally and light traction was applied.  The operative leg was brought into neutral position.  The flouroscopic c-arm was brought between the legs for an AP image.  The patient was prepped and draped in a sterile fashion.  Time-out was performed and landmarks were identified. Traction was obtained and care was taken to ensure the least amount of force necessary to allow safe access to the joint of 8-16mm.  This was checked with fluoroscopy.    Next  we placed an anterolateral portal under the assistance of fluoroscopy.  First, fluoroscopy was used to estimate the trajectory and starting point.  A 5mm incision with a #11 blade was made and a straight hemostat was used to dilate the portal through the appropriate tract.  We then placed a 14-gauge hypodermic needle with careful technique to be as close to the femoral head as possible and parallel to the sorcele to ensure no iatrogenic damage to the labrum.  This released the negative pressure environment and the amount of traction was adjusted to maintain the 8-65mm of distraction.  A nitinol wire was placed through the needle and flouroscopy was used to ensure it extended to the medial wall of the acetabulum.  The Flowport from [employer] was placed over the wire and the nitinol wire was retracted to just inside the capsule during insertion of the dilator and cannula to minimize the risk of breakage. The arthroscope was placed next and we visualized the anterior triangle.     We then placed the anterior portal under direct visualization using the technique described above.  This was safely placed as well without damage to the labrum or femoral head.  We then switched our arthroscope to the anterior portal to ensure we were not through the labrum - we were safely through the capsule only.  We then proceeded with a transverse capsulotomy connecting the 2 portals in the same plane utilizing the Samurai blade from [employer].  We identified the anterior inferior iliac spine proximally, the psoas tendon medially and the rectus tendon laterally as landmarks.  We then proceeded with a diagnostic arthroscopy - the results can be found in the findings section above.    We then used the 50 degree hip specific radiofrequency device from [employer]. and a 4mm shaver to clear the superior acetabulum and expose the subspinous region.  Next we exposed the acetabular rim leaving the chondral labral  junction intact.  Working from both portals, the acetabular rim/subspinous region was reshaped with 5.5 mm bur consistent with the preoperative three-dimensional imaging.   When adequate reshaping was obtained we then proceeded with the labral repair. We placed 2 anchors at the 1:00 and 2:30 positions. The sutures were passed using the crescent Nanopass from [employer].  This resulted in anatomic labral repair.  We debrided the loose cartilage at the rim and residual degenerative labral tissue.  Traction was let down with total traction time of 32 minutes.    This time a burr was introduced into the anterior portal and a three-dimensional resection was performed of the cam lesion under elucidation.  This was done to restore an acceptable cam angle.   Finally, we performed a complete capsular closure with tape suture.  She was replaced in the anterior and posterior limb of the reported capsulotomy with excellent apposition. We then removed the arthroscope and closed the incisions with 3-0 nylon simple stitches.  A sterile dressing was applied..  The patient was awakened from anesthesia and transferred to PACU in stable condition.        POSTOPERATIVE PLAN:    Weightbearing as tolerated right leg Formal physical therapy will begin this week. ASA 325 Daily for DVT prophylaxis       Carmina Chris, MD 2:45 PM

## 2023-12-20 NOTE — Transfer of Care (Signed)
 Immediate Anesthesia Transfer of Care Note  Patient: Shawn Novak  Procedure(s) Performed: ARTHROSCOPY, HIP, WITH LABRUM REPAIR (Right: Hip)  Patient Location: PACU  Anesthesia Type:General  Level of Consciousness: drowsy and patient cooperative  Airway & Oxygen Therapy: Patient Spontanous Breathing and Patient connected to nasal cannula oxygen  Post-op Assessment: Report given to RN and Post -op Vital signs reviewed and stable  Post vital signs: Reviewed and stable  Last Vitals:  Vitals Value Taken Time  BP 125/63 12/20/23 1418  Temp    Pulse 66 12/20/23 1419  Resp 22 12/20/23 1419  SpO2 100 % 12/20/23 1419  Vitals shown include unfiled device data.  Last Pain:  Vitals:   12/20/23 1055  TempSrc: Temporal  PainSc: 0-No pain         Complications: No notable events documented.

## 2023-12-20 NOTE — H&P (Signed)
 Expand All Collapse All       Chief Complaint: Right hip pain        History of Present Illness:      Shawn Novak is a 18 y.o. male presents today with ongoing pain in the right hip and groin.  This has been occurring now for the last 6 months.  This has been waxing and waning and is directly correlated with the amount of activity he has been doing in cross-country.  He is currently in college at [employer].  He has been working with dry needling and his athletic trainer in terms of his strengthening and rehab program which is not improving him at all.  Does have a history of ulcerative colitis.     PMH/PSH/Family History/Social History/Meds/Allergies:         Past Medical History:  Diagnosis Date   Ulcerative colitis [facility])               Past Surgical History:  Procedure Laterality Date   BIOPSY   02/15/2023    Procedure: BIOPSY;  Surgeon: Daina Drum, MD;  Location: [facility];  Service: Gastroenterology;;   [facility] SIGMOIDOSCOPY N/A 02/15/2023    Procedure: Marlynn Singer;  Surgeon: Daina Drum, MD;  Location: [facility];  Service: Gastroenterology;  Laterality: N/A;        Social History         Socioeconomic History   Marital status: Single      Spouse name: Not on file   Number of children: Not on file   Years of education: Not on file   Highest education level: Not on file  Occupational History   Not on file  Tobacco Use   Smoking status: Never   Smokeless tobacco: Never  Vaping Use   Vaping status: Never Used  Substance and Sexual Activity   Alcohol use: No   Drug use: No   Sexual activity: Not on file  Other Topics Concern   Not on file  Social History Narrative   Not on file    Social Drivers of Health        Financial Resource Strain: Not on file  Food Insecurity: No Food Insecurity (02/15/2023)    Hunger Vital Sign     Worried About Running Out of Food in the Last Year: Never true     Ran Out of Food in the Last  Year: Never true  Transportation Needs: No Transportation Needs (02/15/2023)    PRAPARE - Therapist, art (Medical): No     Lack of Transportation (Non-Medical): No  Physical Activity: Not on file  Stress: No Stress Concern Present (07/28/2023)    Received from [facility] of Occupational Health - Occupational Stress Questionnaire     Feeling of Stress : Not at all  Social Connections: Unknown (04/05/2023)    Received from [facility]    Social Network     Social Network: Not on file         Family History  Problem Relation Age of Onset   Ulcerative colitis Father     Allergic rhinitis Neg Hx     Angioedema Neg Hx     Asthma Neg Hx     Eczema Neg Hx     Urticaria Neg Hx          Allergies  No Known Allergies         Current Outpatient Medications  Medication Sig Dispense Refill   acetaminophen  (TYLENOL ) 500 MG tablet Take 1 tablet (500 mg total) by mouth every 8 (eight) hours for 10 days. 30 tablet 0   aspirin  EC 325 MG tablet Take 1 tablet (325 mg total) by mouth daily. 14 tablet 0   oxyCODONE  (ROXICODONE ) 5 MG immediate release tablet Take 1 tablet (5 mg total) by mouth every 4 (four) hours as needed for severe pain (pain score 7-10) or breakthrough pain. 30 tablet 0   benzonatate  (TESSALON ) 100 MG capsule Take 1 capsule (100 mg total) by mouth every 8 (eight) hours. 21 capsule 0   dicyclomine  (BENTYL ) 20 MG tablet Take 1 tablet (20 mg total) by mouth 3 (three) times daily as needed for spasms. 60 tablet 1   fluticasone  (FLONASE ) 50 MCG/ACT nasal spray Place 2 sprays into both nostrils daily for 7 days. 1 mL 2   mesalamine  (APRISO ) 0.375 g 24 hr capsule Take 4 capsules (1.5 g total) by mouth daily. 360 capsule 3   Upadacitinib  ER (RINVOQ ) 15 MG TB24 Take 1 tablet (15 mg total) by mouth daily. 30 tablet 2      No current facility-administered medications for this visit.      Imaging Results (Last 48 hours)  No results  found.     Review of Systems:   A ROS was performed including pertinent positives and negatives as documented in the HPI.   Physical Exam :   Constitutional: NAD and appears stated age Neurological: Alert and oriented Psych: Appropriate affect and cooperative There were no vitals taken for this visit.    Comprehensive Musculoskeletal Exam:     Positive FADIR with 30 degrees internal rotation of the hip in 90 degrees of flexion.  Negative FABER.  45 degrees of external rotation is without pain.  Distal neurosensory exam is intact     Imaging:   Xray (3 views right hip): Alpha angle measuring 65 degrees   MRI (right hip): Anterior superior labral tear     I personally reviewed and interpreted the radiographs.     Assessment and Plan:   18 y.o. 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  At this time he has trialed physical therapy as well as strengthening and activity restriction.  None of these have given him relief.  Given this I did discuss the possibility of right hip arthroscopy with labral repair and cam debridement.  I did discuss risks and limitations associated with this.  After discussion with both him and his mom he has elected for this   -Plan for right hip arthroscopy with cam debridement and labral repair     After a lengthy discussion of treatment options, including risks, benefits, alternatives, complications of surgical and nonsurgical conservative options, the patient elected surgical repair.    The patient  is aware of the material risks  and complications including, but not limited to injury to adjacent structures, neurovascular injury, infection, numbness, bleeding, implant failure, thermal burns, stiffness, persistent pain, failure to heal, disease transmission from allograft, need for further surgery, dislocation, anesthetic risks, blood clots, risks of death,and others. The probabilities of surgical success and  failure discussed with patient given their particular co-morbidities.The time and nature of expected rehabilitation and recovery was discussed.The patient's questions were all answered preoperatively.  No barriers to understanding were noted. I explained the natural history of the disease process and Rx rationale.  I explained to the patient what I  considered to be reasonable expectations given their personal situation.  The final treatment plan was arrived at through a shared patient decision making process model.       I personally saw and evaluated the patient, and participated in the management and treatment plan.   Wilhelmenia Harada, MD Attending Physician, Orthopedic Surgery

## 2023-12-20 NOTE — Discharge Instructions (Addendum)
 Discharge Instructions    Attending Surgeon: Shawn Harada, MD Office Phone Number: [phone]   Diagnosis and Procedures:    Surgeries Performed: Right hip labral repair, CAM debridement  Discharge Plan:    Diet: Resume usual diet. Begin with light or bland foods.  Drink plenty of fluids.  Activity:  Weight bearing as tolerated right leg. You are advised to go home directly from the hospital or surgical center. Restrict your activities.  GENERAL INSTRUCTIONS: 1.  Please apply ice to your wound to help with swelling and inflammation. This will improve your comfort and your overall recovery following surgery.     2. Please call Dr. Verline Glow office at [phone] with questions Monday-Friday during business hours. If no one answers, please leave a message and someone should get back to the patient within 24 hours. For emergencies please call 911 or proceed to the emergency room.   3. Patient to notify surgical team if experiences any of the following: Bowel/Bladder dysfunction, uncontrolled pain, nerve/muscle weakness, incision with increased drainage or redness, nausea/vomiting and Fever greater than 101.0 F.  Be alert for signs of infection including redness, streaking, odor, fever or chills. Be alert for excessive pain or bleeding and notify your surgeon immediately.  WOUND INSTRUCTIONS:   Leave your dressing, cast, or splint in place until your post operative visit.  Keep it clean and dry.  Always keep the incision clean and dry until the staples/sutures are removed. If there is no drainage from the incision you should keep it open to air. If there is drainage from the incision you must keep it covered at all times until the drainage stops  Do not soak in a bath tub, hot tub, pool, lake or other body of water until 21 days after your surgery and your incision is completely dry and healed.  If you have removable sutures (or staples) they must be removed 10-14 days  (unless otherwise instructed) from the day of your surgery.     1)  Elevate the extremity as much as possible.  2)  Keep the dressing clean and dry.  3)  Please call us  if the dressing becomes wet or dirty.  4)  If you are experiencing worsening pain or worsening swelling, please call.     MEDICATIONS: Resume all previous home medications at the previous prescribed dose and frequency unless otherwise noted Start taking the  pain medications on an as-needed basis as prescribed  Please taper down pain medication over the next week following surgery.  Ideally you should not require a refill of any narcotic pain medication.  Take pain medication with food to minimize nausea. In addition to the prescribed pain medication, you may take over-the-counter pain relievers such as Tylenol .  Do NOT take additional tylenol  if your pain medication already has tylenol  in it.  Aspirin  325mg  daily per instructions on bottle. Narcotic policy: Per [facility] clinic policy, our goal is ensure optimal postoperative pain control with a multimodal pain management strategy. For all [facility] patients, our goal is to wean post-operative narcotic medications by 6 weeks post-operatively, and many times sooner. If this is not possible due to utilization of pain medication prior to surgery, your [facility] doctor will support your acute post-operative pain control for the first 6 weeks postoperatively, with a plan to transition you back to your primary pain team following that. Max Spain will work to ensure a Therapist, occupational.       FOLLOWUP INSTRUCTIONS: 1. Follow up at the  Physical Therapy Clinic 3-4 days following surgery. This appointment should be scheduled unless other arrangements have been made.The Physical Therapy scheduling number is [phone] if an appointment has not already been arranged.  2. Contact Dr. Verline Glow office during office hours at [phone] or the practice after hours line at [phone] for  non-emergencies. For medical emergencies call 911.   Discharge Location: Home    Post Anesthesia Home Care Instructions  Activity: Get plenty of rest for the remainder of the day. A responsible individual must stay with you for 24 hours following the procedure.  For the next 24 hours, DO NOT: -Drive a car -Advertising copywriter -Drink alcoholic beverages -Take any medication unless instructed by your physician -Make any legal decisions or sign important papers.  Meals: Start with liquid foods such as gelatin or soup. Progress to regular foods as tolerated. Avoid greasy, spicy, heavy foods. If nausea and/or vomiting occur, drink only clear liquids until the nausea and/or vomiting subsides. Call your physician if vomiting continues.  Special Instructions/Symptoms: Your throat may feel dry or sore from the anesthesia or the breathing tube placed in your throat during surgery. If this causes discomfort, gargle with warm salt water. The discomfort should disappear within 24 hours.  If you had a scopolamine patch placed behind your ear for the management of post- operative nausea and/or vomiting:  1. The medication in the patch is effective for 72 hours, after which it should be removed.  Wrap patch in a tissue and discard in the trash. Wash hands thoroughly with soap and water. 2. You may remove the patch earlier than 72 hours if you experience unpleasant side effects which may include dry mouth, dizziness or visual disturbances. 3. Avoid touching the patch. Wash your hands with soap and water after contact with the patch.    Next dose of tylenol  if needed is after 5PM Next dose of ibuprofen  if needed is after 5PM

## 2023-12-20 NOTE — Anesthesia Postprocedure Evaluation (Signed)
 Anesthesia Post Note  Patient: Shawn Novak  Procedure(s) Performed: ARTHROSCOPY, HIP, WITH LABRUM REPAIR (Right: Hip)     Patient location during evaluation: PACU Anesthesia Type: General Level of consciousness: awake and alert, oriented and patient cooperative Pain management: pain level controlled Vital Signs Assessment: post-procedure vital signs reviewed and stable Respiratory status: spontaneous breathing, nonlabored ventilation and respiratory function stable Cardiovascular status: blood pressure returned to baseline and stable Postop Assessment: no apparent nausea or vomiting Anesthetic complications: no   No notable events documented.  Last Vitals:  Vitals:   12/20/23 1419 12/20/23 1444  BP: 125/63 (!) 125/55  Pulse: 84 67  Resp: 18 (!) 22  Temp: (!) 36.1 C   SpO2: 100% 99%    Last Pain:  Vitals:   12/20/23 1448  TempSrc:   PainSc: 5                  Jacquelyne Matte

## 2023-12-22 ENCOUNTER — Telehealth (HOSPITAL_BASED_OUTPATIENT_CLINIC_OR_DEPARTMENT_OTHER): Payer: Self-pay | Admitting: Physical Therapy

## 2023-12-22 NOTE — Telephone Encounter (Signed)
 Called and LVM to remind patient of upcoming physical therapy evaluation appointment. Requested call back to confirm appointment attendance.

## 2023-12-23 ENCOUNTER — Ambulatory Visit (HOSPITAL_BASED_OUTPATIENT_CLINIC_OR_DEPARTMENT_OTHER): Attending: Orthopaedic Surgery | Admitting: Physical Therapy

## 2023-12-23 ENCOUNTER — Ambulatory Visit (HOSPITAL_BASED_OUTPATIENT_CLINIC_OR_DEPARTMENT_OTHER): Admitting: Physical Therapy

## 2023-12-23 ENCOUNTER — Encounter (HOSPITAL_BASED_OUTPATIENT_CLINIC_OR_DEPARTMENT_OTHER): Payer: Self-pay | Admitting: Physical Therapy

## 2023-12-23 ENCOUNTER — Other Ambulatory Visit: Payer: Self-pay

## 2023-12-23 DIAGNOSIS — M25859 Other specified joint disorders, unspecified hip: Secondary | ICD-10-CM | POA: Insufficient documentation

## 2023-12-23 DIAGNOSIS — M25551 Pain in right hip: Secondary | ICD-10-CM | POA: Insufficient documentation

## 2023-12-23 DIAGNOSIS — R262 Difficulty in walking, not elsewhere classified: Secondary | ICD-10-CM | POA: Insufficient documentation

## 2023-12-23 DIAGNOSIS — M6281 Muscle weakness (generalized): Secondary | ICD-10-CM | POA: Insufficient documentation

## 2023-12-23 NOTE — Therapy (Signed)
 OUTPATIENT PHYSICAL THERAPY EVALUATION   Patient Name: Shawn Novak MRN: [id number] DOB:10/20/2005, 18 y.o., male Today's Date: 12/24/2023  END OF SESSION:  PT End of Session - 12/23/23 1439     Visit Number 1    Number of Visits 20    Date for PT Re-Evaluation 03/18/24    Authorization Type M[employer] healthy blue    PT Start Time 1437    PT Stop Time 1510    PT Time Calculation (min) 33 min    Activity Tolerance Patient tolerated treatment well    Behavior During Therapy [facility] for tasks assessed/performed             Past Medical History:  Diagnosis Date   Ulcerative colitis (HCC)    Past Surgical History:  Procedure Laterality Date   BIOPSY  02/15/2023   Procedure: BIOPSY;  Surgeon: Daina Drum, MD;  Location: [facility];  Service: Gastroenterology;;   Mil[facility] Alosa SIGMOIDOSCOPY N/A 02/15/2023   Procedure: Marlynn Singer;  Surgeon: Daina Drum, MD;  Location: [facility];  Service: Gastroenterology;  Laterality: N/A;   Patient Active Problem List   Diagnosis Date Noted   Femoroacetabular impingement of right hip 12/20/2023   Labral tear of right hip joint 11/04/2023   Dietary counseling and surveillance 02/25/2023   Ulcerative colitis (HCC) 02/15/2023   Severe diarrhea 02/14/2023   Stress fracture of fibula 02/05/2022   Strain of calf muscle 01/15/2021     REFERRING PROVIDER:  Wilhelmenia Harada, MD     REFERRING DIAG:  M25.859 (ICD-10-CM) - Femoroacetabular impingement    s/p Rt hip labral repair  Rationale for Evaluation and Treatment: Rehabilitation  THERAPY DIAG:  Pain in right hip  Muscle weakness (generalized)  Difficulty in walking, not elsewhere classified  ONSET DATE: DOS 12/20/23   SUBJECTIVE:                                                                                                                                                                                           SUBJECTIVE STATEMENT: Denies pain at rest.    PERTINENT HISTORY:  none  PAIN:  Are you having pain? No  PRECAUTIONS:  None  RED FLAGS: None   WEIGHT BEARING RESTRICTIONS:  WBAT  FALLS:  Has patient fallen in last 6 months? No  LIVING ENVIRONMENT: Stairs in home  OCCUPATION:  Student  PLOF:  Independent  PATIENT GOALS:  Back to normal   OBJECTIVE:  Note: Objective measures were completed at Evaluation unless otherwise noted.  PATIENT SURVEYS:  LEFS 19/80  COGNITIVE STATUS: Within functional limits for tasks assessed   SENSATION: WFL   GAIT:  EVAl: arrived on bil axillary crutches   Body Part #1 Hip  PALPATION: EVAL: guarding with PROM, WFL end feels  EVAL ROM: hip flexion to 80, abd to 30 EVAL strength: able to demo quad set with hold                                                                                                                             TREATMENT DATE:   4/24: Changed bandages See HEP   PATIENT EDUCATION:  Education details: Anatomy of condition, POC, HEP, exercise form/rationale Person educated: Patient and Parent Education method: Explanation, Demonstration, Tactile cues, Verbal cues, and Handouts Education comprehension: verbalized understanding, returned demonstration, verbal cues required, tactile cues required, and needs further education  HOME EXERCISE PROGRAM: Access Code: [email or web address] URL: [email or web address] Exercises - Supine Quad Set  - 2-3 x daily - 7 x weekly - 1 sets - 10 reps - 3s hold - Lying Prone  - 2-3 x daily - 7 x weekly - 3-5 min hold - Prone Gluteal Sets  - 2-3 x daily - 7 x weekly - 1 sets - 10 reps - 3s hold - Prone Knee Flexion  - 2-3 x daily - 7 x weekly - 3 sets - 10 reps - Seated Hamstring Stretch  - 2-3 x daily - 7 x weekly - 2 sets - 5 breaths hold  ASSESSMENT:  CLINICAL IMPRESSION: Patient is a 18 y.o. M who was seen today for physical therapy evaluation and treatment for s/p Rt hip labral repair. Will be doing  PT at [employer] closer to school for the next 2 weeks as he finishes up the semester.     REHAB POTENTIAL: Good  CLINICAL DECISION MAKING: Stable/uncomplicated  EVALUATION COMPLEXITY: Low   GOALS: Goals reviewed with patient? Yes  SHORT TERM GOALS: Target date: 4 weeks 5/19  Pain free flexion to 90 Baseline: Goal status: INITIAL   2.  Demo controlled quad set with hold Baseline:  Goal status: INITIAL   3.  30s sit to stand without pain or compensation Baseline:  Goal status: INITIAL   4.  SLS 30s without compensation Baseline:  Goal status: INITIAL  LONG TERM GOALS:  Able to demo step up on 6" step with level pelvis and proper form Baseline:  Goal status: INITIAL Week 8 6/16  2.  Will tolerate at least 3 min on elliptical, demonstrating good tolerance to repetitive weight bearing motion Baseline:  Goal status: INITIAL  Week 8 6/16  3.  Demonstrate proper form in at least 10 continuous lunges without increased pain Baseline:  Goal status: INITIAL  Week 12 7/14  4.  Demonstrate gentle, double and single foot plyometric motions with good proximal form Baseline:  Goal status: INITIAL Week 12 7/14  5.  Further functional goals to be set at POC date    PLAN:  PT FREQUENCY: 1-2x/week- 2 week gap as he will be finishing finals at school.   PT DURATION:  POC date  PLANNED INTERVENTIONS: 97164- PT Re-evaluation, 97750- Physical Performance Testing, 97110-Therapeutic exercises, 97530- Therapeutic activity, [id number]- Neuromuscular re-education, [phone]- Self Care, 19147- Manual therapy, [phone]- Gait training, [phone]- Aquatic Therapy, Patient/Family education, Balance training, Stair training, Taping, Dry Needling, Joint mobilization, Spinal mobilization, Scar mobilization, Cryotherapy, and Moist heat.  PLAN FOR NEXT SESSION: per protocol  Genita Nilsson C. Vincentina Sollers PT, DPT 12/24/23 7:52 PM   For all possible CPT codes, reference the Planned Interventions line above.      Check all conditions that are expected to impact treatment: {Conditions expected to impact treatment:None of these apply   If treatment provided at initial evaluation, no treatment charged due to lack of authorization.

## 2023-12-24 ENCOUNTER — Encounter (HOSPITAL_BASED_OUTPATIENT_CLINIC_OR_DEPARTMENT_OTHER): Payer: Self-pay | Admitting: Physical Therapy

## 2023-12-29 ENCOUNTER — Encounter (HOSPITAL_BASED_OUTPATIENT_CLINIC_OR_DEPARTMENT_OTHER): Admitting: Physical Therapy

## 2023-12-30 ENCOUNTER — Ambulatory Visit (HOSPITAL_BASED_OUTPATIENT_CLINIC_OR_DEPARTMENT_OTHER): Admitting: Orthopaedic Surgery

## 2023-12-30 DIAGNOSIS — M25851 Other specified joint disorders, right hip: Secondary | ICD-10-CM

## 2023-12-30 NOTE — Progress Notes (Signed)
 Post Operative Evaluation    Procedure/Date of Surgery: Right hip arthroscopy with cam debridement labral repair 4/21  Interval History:    Presents 2 weeks status post the above procedure.  Overall he is doing well.  He is fully weightbearing at today's visit   PMH/PSH/Family History/Social History/Meds/Allergies:    Past Medical History:  Diagnosis Date   Ulcerative colitis [facility])    Past Surgical History:  Procedure Laterality Date   BIOPSY  02/15/2023   Procedure: BIOPSY;  Surgeon: Daina Drum, MD;  Location: [facility];  Service: Gastroenterology;;   Milta Alosa SIGMOIDOSCOPY N/A 02/15/2023   Procedure: Marlynn Singer;  Surgeon: Daina Drum, MD;  Location: [facility];  Service: Gastroenterology;  Laterality: N/A;   Social History   Socioeconomic History   Marital status: Single    Spouse name: Not on file   Number of children: Not on file   Years of education: Not on file   Highest education level: Not on file  Occupational History   Not on file  Tobacco Use   Smoking status: Never   Smokeless tobacco: Never  Vaping Use   Vaping status: Never Used  Substance and Sexual Activity   Alcohol use: No   Drug use: No   Sexual activity: Not on file  Other Topics Concern   Not on file  Social History Narrative   Not on file   Social Drivers of Health   Financial Resource Strain: Not on file  Food Insecurity: No Food Insecurity (02/15/2023)   Hunger Vital Sign    Worried About Running Out of Food in the Last Year: Never true    Ran Out of Food in the Last Year: Never true  Transportation Needs: No Transportation Needs (02/15/2023)   PRAPARE - Administrator, Civil Service (Medical): No    Lack of Transportation (Non-Medical): No  Physical Activity: Not on file  Stress: No Stress Concern Present (07/28/2023)   Received from [facility] - Occupational Stress  Questionnaire    Feeling of Stress : Not at all  Social Connections: Unknown (04/05/2023)   Received from [facility]   Social Network    Social Network: Not on file   Family History  Problem Relation Age of Onset   Ulcerative colitis Father    Allergic rhinitis Neg Hx    Angioedema Neg Hx    Asthma Neg Hx    Eczema Neg Hx    Urticaria Neg Hx    No Known Allergies Current Outpatient Medications  Medication Sig Dispense Refill   aspirin  EC 325 MG tablet Take 1 tablet (325 mg total) by mouth daily. 14 tablet 0   dicyclomine  (BENTYL ) 20 MG tablet Take 1 tablet (20 mg total) by mouth 3 (three) times daily as needed for spasms. 60 tablet 1   fluticasone  (FLONASE ) 50 MCG/ACT nasal spray Place 2 sprays into both nostrils daily for 7 days. (Patient not taking: Reported on 12/09/2023) 1 mL 2   mesalamine  (APRISO ) 0.375 g 24 hr capsule Take 4 capsules (1.5 g total) by mouth daily. (Patient not taking: Reported on 12/09/2023) 360 capsule 3   oxyCODONE  (ROXICODONE ) 5 MG immediate release tablet Take 1 tablet (5 mg total) by mouth every 4 (four) hours as needed for severe  pain (pain score 7-10) or breakthrough pain. 30 tablet 0   Upadacitinib  ER (RINVOQ ) 15 MG TB24 Take 1 tablet (15 mg total) by mouth daily. 30 tablet 2   No current facility-administered medications for this visit.   No results found.  Review of Systems:   A ROS was performed including pertinent positives and negatives as documented in the HPI.   Musculoskeletal Exam:    There were no vitals taken for this visit.  Right hip incisions are well-appearing without erythema or drainage.  Incision is from 30 degrees and external rotation to 30 degrees without pain.  Walks with a normal gait distal neurosensory exam is intact  Imaging:      I personally reviewed and interpreted the radiographs.   Assessment:   2-week status post right hip arthroscopy with labral repair and cam debridement overall doing extremely well.  I  will plan to see him back in 4 weeks for reassessment.  Will continue strengthening at this time  Plan :    - Return to clinic 4 weeks for reassessment      I personally saw and evaluated the patient, and participated in the management and treatment plan.  Wilhelmenia Harada, MD Attending Physician, Orthopedic Surgery  This document was dictated using Dragon voice recognition software. A reasonable attempt at proof reading has been made to minimize errors.

## 2024-01-05 ENCOUNTER — Encounter (HOSPITAL_BASED_OUTPATIENT_CLINIC_OR_DEPARTMENT_OTHER): Admitting: Physical Therapy

## 2024-01-12 ENCOUNTER — Encounter (HOSPITAL_BASED_OUTPATIENT_CLINIC_OR_DEPARTMENT_OTHER): Payer: Self-pay | Admitting: Physical Therapy

## 2024-01-12 ENCOUNTER — Ambulatory Visit (HOSPITAL_BASED_OUTPATIENT_CLINIC_OR_DEPARTMENT_OTHER): Attending: Orthopaedic Surgery | Admitting: Physical Therapy

## 2024-01-12 DIAGNOSIS — M25551 Pain in right hip: Secondary | ICD-10-CM | POA: Insufficient documentation

## 2024-01-12 DIAGNOSIS — R262 Difficulty in walking, not elsewhere classified: Secondary | ICD-10-CM | POA: Insufficient documentation

## 2024-01-12 DIAGNOSIS — M6281 Muscle weakness (generalized): Secondary | ICD-10-CM | POA: Insufficient documentation

## 2024-01-12 NOTE — Therapy (Signed)
 OUTPATIENT PHYSICAL THERAPY EVALUATION   Patient Name: Shawn Novak MRN: [id number] DOB:Dec 23, 2005, 18 y.o., male Today's Date: 01/12/2024  END OF SESSION:  PT End of Session - 01/12/24 0941     Visit Number 2    Number of Visits 20    Date for PT Re-Evaluation 03/18/24    Authorization Type M[employer] healthy blue    PT Start Time [id number]   late arrival   PT Stop Time 1010    PT Time Calculation (min) 28 min    Activity Tolerance Patient tolerated treatment well    Behavior During Therapy [facility] for tasks assessed/performed             Past Medical History:  Diagnosis Date   Ulcerative colitis (HCC)    Past Surgical History:  Procedure Laterality Date   BIOPSY  02/15/2023   Procedure: BIOPSY;  Surgeon: Daina Drum, MD;  Location: [facility];  Service: Gastroenterology;;   Milta Alosa SIGMOIDOSCOPY N/A 02/15/2023   Procedure: Marlynn Singer;  Surgeon: Daina Drum, MD;  Location: [facility];  Service: Gastroenterology;  Laterality: N/A;   Patient Active Problem List   Diagnosis Date Noted   Femoroacetabular impingement of right hip 12/20/2023   Labral tear of right hip joint 11/04/2023   Dietary counseling and surveillance 02/25/2023   Ulcerative colitis (HCC) 02/15/2023   Severe diarrhea 02/14/2023   Stress fracture of fibula 02/05/2022   Strain of calf muscle 01/15/2021     REFERRING PROVIDER:  Wilhelmenia Harada, MD     REFERRING DIAG:  M25.859 (ICD-10-CM) - Femoroacetabular impingement    s/p Rt hip labral repair  Rationale for Evaluation and Treatment: Rehabilitation  THERAPY DIAG:  Pain in right hip  Muscle weakness (generalized)  Difficulty in walking, not elsewhere classified  ONSET DATE: DOS 12/20/23  Days since surgery: 23    SUBJECTIVE:                                                                                                                                                                                           SUBJECTIVE  STATEMENT: Pt states he has been in PT doing squat, lunges, banded side steps and hip extensions. Pt has been doing single leg squat and [place]. Pt has not had  much pain. Pt is FWB at this time. He is usually sore and uses ice.   PERTINENT HISTORY:  none  PAIN:  Are you having pain? No  PRECAUTIONS:  None  RED FLAGS: None   WEIGHT BEARING RESTRICTIONS:  WBAT  FALLS:  Has patient fallen in last 6 months? No  LIVING ENVIRONMENT: Stairs in home  OCCUPATION:  Student  PLOF:  Independent  PATIENT GOALS:  Back to normal   OBJECTIVE:  Note: Objective measures were completed at Evaluation unless otherwise noted.  PATIENT SURVEYS:  LEFS 19/80  COGNITIVE STATUS: Within functional limits for tasks assessed   SENSATION: WFL   GAIT: EVAl: arrived on bil axillary crutches   Body Part #1 Hip  PALPATION: EVAL: guarding with PROM, WFL end feels  EVAL ROM: hip flexion to 80, abd to 30 EVAL strength: able to demo quad set with hold                                                                                                                             TREATMENT DATE:   5/14  R hip PROM to tolerance Hip LAD with ABD   Thomas stretch off table 30s 3x Bridge 3x10 Supine butterfly stretch 30s 3x Retro walk 55ft 3x  Hip IR/ER 20x  Exercise safety, upright bike for cardio 20 min low to mod intensity, safety and precautions  4/24: Changed bandages See HEP   PATIENT EDUCATION:  Education details: Anatomy of condition, POC, HEP, exercise form/rationale Person educated: Patient and Parent Education method: Explanation, Demonstration, Tactile cues, Verbal cues, and Handouts Education comprehension: verbalized understanding, returned demonstration, verbal cues required, tactile cues required, and needs further education  HOME EXERCISE PROGRAM: Access Code: [email or web address] URL: [email or web address] Exercises - Supine Quad Set  - 2-3 x daily - 7  x weekly - 1 sets - 10 reps - 3s hold - Lying Prone  - 2-3 x daily - 7 x weekly - 3-5 min hold - Prone Gluteal Sets  - 2-3 x daily - 7 x weekly - 1 sets - 10 reps - 3s hold - Prone Knee Flexion  - 2-3 x daily - 7 x weekly - 3 sets - 10 reps - Seated Hamstring Stretch  - 2-3 x daily - 7 x weekly - 2 sets - 5 breaths hold  ASSESSMENT:  CLINICAL IMPRESSION: Pt 3 wks post op at this time. Pt HEP significantly regressed as pt has been doing exercise outside of protocol limits. Pt advised on safety and progressive ROM first prior to loaded exercise. Pt with moderate hip ext deficit that causes excessive lumbar rotation with gait. HEP updated today for ROM focus. Plan to continue with ROM focus and gait quality and progress strength as per protocol. Plan to use mobilizations to assist.   Pt currently awaiting C.diff results.   REHAB POTENTIAL: Good  CLINICAL DECISION MAKING: Stable/uncomplicated  EVALUATION COMPLEXITY: Low   GOALS: Goals reviewed with patient? Yes  SHORT TERM GOALS: Target date: 4 weeks 5/19  Pain free flexion to 90 Baseline: Goal status: INITIAL   2.  Demo controlled quad set with hold Baseline:  Goal status: INITIAL   3.  30s sit to stand without pain or compensation Baseline:  Goal status: INITIAL   4.  SLS 30s without compensation Baseline:  Goal status:  INITIAL  LONG TERM GOALS:  Able to demo step up on 6" step with level pelvis and proper form Baseline:  Goal status: INITIAL Week 8 6/16  2.  Will tolerate at least 3 min on elliptical, demonstrating good tolerance to repetitive weight bearing motion Baseline:  Goal status: INITIAL  Week 8 6/16  3.  Demonstrate proper form in at least 10 continuous lunges without increased pain Baseline:  Goal status: INITIAL  Week 12 7/14  4.  Demonstrate gentle, double and single foot plyometric motions with good proximal form Baseline:  Goal status: INITIAL Week 12 7/14  5.  Further functional goals to be set  at POC date    PLAN:  PT FREQUENCY: 1-2x/week- 2 week gap as he will be finishing finals at school.   PT DURATION: POC date  PLANNED INTERVENTIONS: 97164- PT Re-evaluation, 97750- Physical Performance Testing, 97110-Therapeutic exercises, 97530- Therapeutic activity, [id number]- Neuromuscular re-education, [phone]- Self Care, 60454- Manual therapy, [phone]- Gait training, [phone]- Aquatic Therapy, Patient/Family education, Balance training, Stair training, Taping, Dry Needling, Joint mobilization, Spinal mobilization, Scar mobilization, Cryotherapy, and Moist heat.  PLAN FOR NEXT SESSION: per protocol  Silver Dross PT, DPT 01/12/24 10:20 AM

## 2024-01-19 ENCOUNTER — Ambulatory Visit (HOSPITAL_BASED_OUTPATIENT_CLINIC_OR_DEPARTMENT_OTHER): Admitting: Physical Therapy

## 2024-01-19 ENCOUNTER — Encounter (HOSPITAL_BASED_OUTPATIENT_CLINIC_OR_DEPARTMENT_OTHER): Payer: Self-pay | Admitting: Physical Therapy

## 2024-01-19 DIAGNOSIS — M25551 Pain in right hip: Secondary | ICD-10-CM | POA: Diagnosis not present

## 2024-01-19 DIAGNOSIS — M6281 Muscle weakness (generalized): Secondary | ICD-10-CM

## 2024-01-19 DIAGNOSIS — R262 Difficulty in walking, not elsewhere classified: Secondary | ICD-10-CM

## 2024-01-19 NOTE — Therapy (Signed)
 OUTPATIENT PHYSICAL THERAPY EVALUATION   Patient Name: Shawn Novak MRN: [id number] DOB:21-Oct-2005, 18 y.o., male Today's Date: 01/19/2024  END OF SESSION:  PT End of Session - 01/19/24 1239     Visit Number 3    Number of Visits 20    Date for PT Re-Evaluation 03/18/24    Authorization Type MCD healthy blue    PT Start Time 0848    PT Stop Time 0928    PT Time Calculation (min) 40 min    Activity Tolerance Patient tolerated treatment well    Behavior During Therapy [facility] for tasks assessed/performed              Past Medical History:  Diagnosis Date   Ulcerative colitis [facility])    Past Surgical History:  Procedure Laterality Date   BIOPSY  02/15/2023   Procedure: BIOPSY;  Surgeon: Daina Drum, MD;  Location: [facility];  Service: Gastroenterology;;   Milta Alosa SIGMOIDOSCOPY N/A 02/15/2023   Procedure: Marlynn Singer;  Surgeon: Daina Drum, MD;  Location: [facility];  Service: Gastroenterology;  Laterality: N/A;   Patient Active Problem List   Diagnosis Date Noted   Femoroacetabular impingement of right hip 12/20/2023   Labral tear of right hip joint 11/04/2023   Dietary counseling and surveillance 02/25/2023   Ulcerative colitis (HCC) 02/15/2023   Severe diarrhea 02/14/2023   Stress fracture of fibula 02/05/2022   Strain of calf muscle 01/15/2021     REFERRING PROVIDER:  Wilhelmenia Harada, MD     REFERRING DIAG:  M25.859 (ICD-10-CM) - Femoroacetabular impingement    s/p Rt hip labral repair  Rationale for Evaluation and Treatment: Rehabilitation  THERAPY DIAG:  Pain in right hip  Muscle weakness (generalized)  Difficulty in walking, not elsewhere classified  ONSET DATE: DOS 12/20/23  Days since surgery: 30    SUBJECTIVE:                                                                                                                                                                                           SUBJECTIVE  STATEMENT: Patient reports no pain this morning. He reports he has been doing well.    PERTINENT HISTORY:  none  PAIN:  Are you having pain? No  PRECAUTIONS:  None  RED FLAGS: None   WEIGHT BEARING RESTRICTIONS:  WBAT  FALLS:  Has patient fallen in last 6 months? No  LIVING ENVIRONMENT: Stairs in home  OCCUPATION:  Student  PLOF:  Independent  PATIENT GOALS:  Back to normal   OBJECTIVE:  Note: Objective measures were completed at Evaluation unless otherwise noted.  PATIENT SURVEYS:  LEFS  19/80  COGNITIVE STATUS: Within functional limits for tasks assessed   SENSATION: WFL   GAIT: EVAl: arrived on bil axillary crutches   Body Part #1 Hip  PALPATION: EVAL: guarding with PROM, WFL end feels  EVAL ROM: hip flexion to 80, abd to 30 EVAL strength: able to demo quad set with hold                                                                                                                             TREATMENT DATE:  5/21 Manual: reviewed roller to anterior hip and self soft tissue mobilization   There-ex:  PROM into flexion, ER and IR   Prone SLR x12 RPE of 3  2x12 1.5 lbs RPE of 5   Prone ER/IR 2x15  Bent knee fall outs 2x10   Neuro-re -ed Quadruped rock x12  Side lying clmashell red 3x12 red RPE of 5         5/14  R hip PROM to tolerance Hip LAD with ABD   Thomas stretch off table 30s 3x Bridge 3x10 Supine butterfly stretch 30s 3x Retro walk 20ft 3x  Hip IR/ER 20x  Exercise safety, upright bike for cardio 20 min low to mod intensity, safety and precautions  4/24: Changed bandages See HEP   PATIENT EDUCATION:  Education details: Anatomy of condition, POC, HEP, exercise form/rationale Person educated: Patient and Parent Education method: Explanation, Demonstration, Tactile cues, Verbal cues, and Handouts Education comprehension: verbalized understanding, returned demonstration, verbal cues required, tactile cues  required, and needs further education  HOME EXERCISE PROGRAM: Access Code: [email or web address] URL: [email or web address] Exercises - Supine Quad Set  - 2-3 x daily - 7 x weekly - 1 sets - 10 reps - 3s hold - Lying Prone  - 2-3 x daily - 7 x weekly - 3-5 min hold - Prone Gluteal Sets  - 2-3 x daily - 7 x weekly - 1 sets - 10 reps - 3s hold - Prone Knee Flexion  - 2-3 x daily - 7 x weekly - 3 sets - 10 reps - Seated Hamstring Stretch  - 2-3 x daily - 7 x weekly - 2 sets - 5 breaths hold  ASSESSMENT:  CLINICAL IMPRESSION: Patient is now 4 weeks post-op. The pain he was having last week has resolved. He is back to working on the protocol. He was given prone exercises and quadruped rocking. He tolerated well. His ROM is progressing well. He was able to ride the exercise bike today. Therapy will continue to progress as tolerated. He was advised to stick with the protocol at this time.    Pt currently awaiting C.diff results.   REHAB POTENTIAL: Good  CLINICAL DECISION MAKING: Stable/uncomplicated  EVALUATION COMPLEXITY: Low   GOALS: Goals reviewed with patient? Yes  SHORT TERM GOALS: Target date: 4 weeks 5/19  Pain free flexion to 90 Baseline: Goal status: INITIAL   2.  Demo controlled quad set with hold Baseline:  Goal status: INITIAL   3.  30s sit to stand without pain or compensation Baseline:  Goal status: INITIAL   4.  SLS 30s without compensation Baseline:  Goal status: INITIAL  LONG TERM GOALS:  Able to demo step up on 6" step with level pelvis and proper form Baseline:  Goal status: INITIAL Week 8 6/16  2.  Will tolerate at least 3 min on elliptical, demonstrating good tolerance to repetitive weight bearing motion Baseline:  Goal status: INITIAL  Week 8 6/16  3.  Demonstrate proper form in at least 10 continuous lunges without increased pain Baseline:  Goal status: INITIAL  Week 12 7/14  4.  Demonstrate gentle, double and single foot plyometric  motions with good proximal form Baseline:  Goal status: INITIAL Week 12 7/14  5.  Further functional goals to be set at POC date    PLAN:  PT FREQUENCY: 1-2x/week- 2 week gap as he will be finishing finals at school.   PT DURATION: POC date  PLANNED INTERVENTIONS: 97164- PT Re-evaluation, 97750- Physical Performance Testing, 97110-Therapeutic exercises, 97530- Therapeutic activity, [id number]- Neuromuscular re-education, [phone]- Self Care, 60454- Manual therapy, [phone]- Gait training, [phone]- Aquatic Therapy, Patient/Family education, Balance training, Stair training, Taping, Dry Needling, Joint mobilization, Spinal mobilization, Scar mobilization, Cryotherapy, and Moist heat.  PLAN FOR NEXT SESSION: per protocol  Silver Dross PT, DPT 01/19/24 12:45 PM

## 2024-01-21 ENCOUNTER — Encounter (HOSPITAL_BASED_OUTPATIENT_CLINIC_OR_DEPARTMENT_OTHER): Payer: Self-pay | Admitting: Physical Therapy

## 2024-01-21 ENCOUNTER — Ambulatory Visit (HOSPITAL_BASED_OUTPATIENT_CLINIC_OR_DEPARTMENT_OTHER): Admitting: Physical Therapy

## 2024-01-21 DIAGNOSIS — M25551 Pain in right hip: Secondary | ICD-10-CM | POA: Diagnosis not present

## 2024-01-21 DIAGNOSIS — M6281 Muscle weakness (generalized): Secondary | ICD-10-CM

## 2024-01-21 DIAGNOSIS — R262 Difficulty in walking, not elsewhere classified: Secondary | ICD-10-CM

## 2024-01-21 NOTE — Therapy (Signed)
 OUTPATIENT PHYSICAL THERAPY EVALUATION   Patient Name: Shawn Novak MRN: [id number] DOB:2006-02-08, 18 y.o., male Today's Date: 01/21/2024  END OF SESSION:  PT End of Session - 01/21/24 0848     Visit Number 4    Number of Visits 20    Date for PT Re-Evaluation 03/18/24    Authorization Type M[employer] healthy blue    PT Start Time 0845    PT Stop Time 0925    PT Time Calculation (min) 40 min    Activity Tolerance Patient tolerated treatment well    Behavior During Therapy [facility] for tasks assessed/performed              Past Medical History:  Diagnosis Date   Ulcerative colitis [facility])    Past Surgical History:  Procedure Laterality Date   BIOPSY  02/15/2023   Procedure: BIOPSY;  Surgeon: Daina Drum, MD;  Location: [facility];  Service: Gastroenterology;;   Milta Alosa SIGMOIDOSCOPY N/A 02/15/2023   Procedure: Marlynn Singer;  Surgeon: Daina Drum, MD;  Location: [facility];  Service: Gastroenterology;  Laterality: N/A;   Patient Active Problem List   Diagnosis Date Noted   Femoroacetabular impingement of right hip 12/20/2023   Labral tear of right hip joint 11/04/2023   Dietary counseling and surveillance 02/25/2023   Ulcerative colitis (HCC) 02/15/2023   Severe diarrhea 02/14/2023   Stress fracture of fibula 02/05/2022   Strain of calf muscle 01/15/2021     REFERRING PROVIDER:  Wilhelmenia Harada, MD     REFERRING DIAG:  M25.859 (ICD-10-CM) - Femoroacetabular impingement    s/p Rt hip labral repair  Rationale for Evaluation and Treatment: Rehabilitation  THERAPY DIAG:  Pain in right hip  Muscle weakness (generalized)  Difficulty in walking, not elsewhere classified  ONSET DATE: DOS 12/20/23  Days since surgery: 32    SUBJECTIVE:                                                                                                                                                                                           SUBJECTIVE  STATEMENT:  Pt reports no issues since last session. Pt has felt the walking has improved.    PERTINENT HISTORY:  none  PAIN:  Are you having pain? No  PRECAUTIONS:  None  RED FLAGS: None   WEIGHT BEARING RESTRICTIONS:  WBAT  FALLS:  Has patient fallen in last 6 months? No  LIVING ENVIRONMENT: Stairs in home  OCCUPATION:  Student  PLOF:  Independent  PATIENT GOALS:  Back to normal   OBJECTIVE:  Note: Objective measures were completed at Evaluation unless otherwise noted.  PATIENT SURVEYS:  LEFS 19/80  COGNITIVE STATUS: Within functional limits for tasks assessed   SENSATION: WFL   GAIT: EVAl: arrived on bil axillary crutches   Body Part #1 Hip  PALPATION: EVAL: guarding with PROM, WFL end feels  EVAL ROM: hip flexion to 80, abd to 30 EVAL strength: able to demo quad set with hold                                                                                                                             TREATMENT DATE:  5/23  Upright bike warm up 5 min  Hip IR/ER on stool 3x10 Hip flexor stretch in standing 30s 3x (gentle) Tall table DL squat 3x8 Half kneeling chop 10lb KB 3x10 each way Figure 4 stretch 30s 3x Standing march 20x Blue TB TKE 3x10   5/21 Manual: reviewed roller to anterior hip and self soft tissue mobilization   There-ex:  PROM into flexion, ER and IR   Prone SLR x12 RPE of 3  2x12 1.5 lbs RPE of 5   Prone ER/IR 2x15  Bent knee fall outs 2x10   Neuro-re -ed Quadruped rock x12  Side lying clmashell red 3x12 red RPE of 5   5/14  R hip PROM to tolerance Hip LAD with ABD   Thomas stretch off table 30s 3x Bridge 3x10 Supine butterfly stretch 30s 3x Retro walk 32ft 3x  Hip IR/ER 20x  Exercise safety, upright bike for cardio 20 min low to mod intensity, safety and precautions  4/24: Changed bandages See HEP   PATIENT EDUCATION:  Education details: Anatomy of condition, POC, HEP, exercise  form/rationale Person educated: Patient and Parent Education method: Explanation, Demonstration, Tactile cues, Verbal cues, and Handouts Education comprehension: verbalized understanding, returned demonstration, verbal cues required, tactile cues required, and needs further education  HOME EXERCISE PROGRAM: Access Code: [email or web address] URL: [email or web address] Exercises - Supine Quad Set  - 2-3 x daily - 7 x weekly - 1 sets - 10 reps - 3s hold - Lying Prone  - 2-3 x daily - 7 x weekly - 3-5 min hold - Prone Gluteal Sets  - 2-3 x daily - 7 x weekly - 1 sets - 10 reps - 3s hold - Prone Knee Flexion  - 2-3 x daily - 7 x weekly - 3 sets - 10 reps - Seated Hamstring Stretch  - 2-3 x daily - 7 x weekly - 2 sets - 5 breaths hold  ASSESSMENT:  CLINICAL IMPRESSION:  Pt nearly 5 wks post op. Pt able to continue with gentle ROM progression in all planes without pain. Pt is still limited into end range ext and rotation as expected but able to gently stretch to tolerance. WB exercise progression started and focus placed on muscle activation and even WB with exercise in FWB positions. Plan to continue with protocol. Pt may start elliptical at 6 wks and advised on POC through the remainder of summer before he starts return to  running program prior to school starting.    Pt currently awaiting C.diff results.   REHAB POTENTIAL: Good  CLINICAL DECISION MAKING: Stable/uncomplicated  EVALUATION COMPLEXITY: Low   GOALS: Goals reviewed with patient? Yes  SHORT TERM GOALS: Target date: 4 weeks 5/19  Pain free flexion to 90 Baseline: Goal status: INITIAL   2.  Demo controlled quad set with hold Baseline:  Goal status: INITIAL   3.  30s sit to stand without pain or compensation Baseline:  Goal status: INITIAL   4.  SLS 30s without compensation Baseline:  Goal status: INITIAL  LONG TERM GOALS:  Able to demo step up on 6" step with level pelvis and proper form Baseline:  Goal  status: INITIAL Week 8 6/16  2.  Will tolerate at least 3 min on elliptical, demonstrating good tolerance to repetitive weight bearing motion Baseline:  Goal status: INITIAL  Week 8 6/16  3.  Demonstrate proper form in at least 10 continuous lunges without increased pain Baseline:  Goal status: INITIAL  Week 12 7/14  4.  Demonstrate gentle, double and single foot plyometric motions with good proximal form Baseline:  Goal status: INITIAL Week 12 7/14  5.  Further functional goals to be set at POC date    PLAN:  PT FREQUENCY: 1-2x/week- 2 week gap as he will be finishing finals at school.   PT DURATION: POC date  PLANNED INTERVENTIONS: 97164- PT Re-evaluation, 97750- Physical Performance Testing, 97110-Therapeutic exercises, 97530- Therapeutic activity, [id number]- Neuromuscular re-education, [phone]- Self Care, 60454- Manual therapy, [phone]- Gait training, [phone]- Aquatic Therapy, Patient/Family education, Balance training, Stair training, Taping, Dry Needling, Joint mobilization, Spinal mobilization, Scar mobilization, Cryotherapy, and Moist heat.  PLAN FOR NEXT SESSION: per protocol  Silver Dross PT, DPT 01/21/24 9:32 AM

## 2024-01-26 ENCOUNTER — Ambulatory Visit (HOSPITAL_BASED_OUTPATIENT_CLINIC_OR_DEPARTMENT_OTHER): Admitting: Physical Therapy

## 2024-01-26 DIAGNOSIS — M25551 Pain in right hip: Secondary | ICD-10-CM

## 2024-01-26 DIAGNOSIS — R262 Difficulty in walking, not elsewhere classified: Secondary | ICD-10-CM

## 2024-01-26 DIAGNOSIS — M6281 Muscle weakness (generalized): Secondary | ICD-10-CM

## 2024-01-26 NOTE — Therapy (Signed)
 OUTPATIENT PHYSICAL THERAPY EVALUATION   Patient Name: Shawn Novak MRN: [id number] DOB:October 25, 2005, 18 y.o., male Today's Date: 01/26/2024  END OF SESSION:     Past Medical History:  Diagnosis Date   Ulcerative colitis [facility])    Past Surgical History:  Procedure Laterality Date   BIOPSY  02/15/2023   Procedure: BIOPSY;  Surgeon: Daina Drum, MD;  Location: [facility];  Service: Gastroenterology;;   Milta Alosa SIGMOIDOSCOPY N/A 02/15/2023   Procedure: Marlynn Singer;  Surgeon: Daina Drum, MD;  Location: [facility];  Service: Gastroenterology;  Laterality: N/A;   Patient Active Problem List   Diagnosis Date Noted   Femoroacetabular impingement of right hip 12/20/2023   Labral tear of right hip joint 11/04/2023   Dietary counseling and surveillance 02/25/2023   Ulcerative colitis (HCC) 02/15/2023   Severe diarrhea 02/14/2023   Stress fracture of fibula 02/05/2022   Strain of calf muscle 01/15/2021  Days since surgery: 37    REFERRING PROVIDER:  Wilhelmenia Harada, MD     REFERRING DIAG:  M25.859 (ICD-10-CM) - Femoroacetabular impingement    s/p Rt hip labral repair  Rationale for Evaluation and Treatment: Rehabilitation  THERAPY DIAG:  No diagnosis found.  ONSET DATE: DOS 12/20/23  Days since surgery: 37    SUBJECTIVE:                                                                                                                                                                                           SUBJECTIVE STATEMENT:  The patient has no pain at this time. He has been working on his exercises. No issues after his last session.    PERTINENT HISTORY:  none  PAIN:  Are you having pain? No  PRECAUTIONS:  None  RED FLAGS: None   WEIGHT BEARING RESTRICTIONS:  WBAT  FALLS:  Has patient fallen in last 6 months? No  LIVING ENVIRONMENT: Stairs in home  OCCUPATION:  Student  PLOF:  Independent  PATIENT GOALS:  Back to  normal   OBJECTIVE:  Note: Objective measures were completed at Evaluation unless otherwise noted.  PATIENT SURVEYS:  LEFS 19/80  COGNITIVE STATUS: Within functional limits for tasks assessed   SENSATION: WFL   GAIT: EVAl: arrived on bil axillary crutches   Body Part #1 Hip  PALPATION: EVAL: guarding with PROM, WFL end feels  EVAL ROM: hip flexion to 80, abd to 30 EVAL strength: able to demo quad set with hold  TREATMENT DATE:  5/28  Manual:  Trigger point release to anterior hip Grade II and III inferior and posterior glides. Noticeable improvement in hip flexion   There-ex:  Bent knee fall out x15  Prone ER/IR x20  Prone hip extension 2lbs   Neuro re-ed:  Bridge x20 blue band  SL hip abduction with abdominal bracing green 2x15   Step onto air-ex fwd and lateral 2x15   Step and drive 4 inch 1O10 bilateral  There-act:  Squat to table 2x15       5/23  Upright bike warm up 5 min  Hip IR/ER on stool 3x10 Hip flexor stretch in standing 30s 3x (gentle) Tall table DL squat 3x8 Half kneeling chop 10lb KB 3x10 each way Figure 4 stretch 30s 3x Standing march 20x Blue TB TKE 3x10   5/21 Manual: reviewed roller to anterior hip and self soft tissue mobilization   There-ex:  PROM into flexion, ER and IR   Prone SLR x12 RPE of 3  2x12 1.5 lbs RPE of 5   Prone ER/IR 2x15  Bent knee fall outs 2x10   Neuro-re -ed Quadruped rock x12  Side lying clmashell red 3x12 red RPE of 5   5/14  R hip PROM to tolerance Hip LAD with ABD   Thomas stretch off table 30s 3x Bridge 3x10 Supine butterfly stretch 30s 3x Retro walk 50ft 3x  Hip IR/ER 20x  Exercise safety, upright bike for cardio 20 min low to mod intensity, safety and precautions  4/24: Changed bandages See HEP   PATIENT EDUCATION:  Education details: Anatomy of  condition, POC, HEP, exercise form/rationale Person educated: Patient and Parent Education method: Explanation, Demonstration, Tactile cues, Verbal cues, and Handouts Education comprehension: verbalized understanding, returned demonstration, verbal cues required, tactile cues required, and needs further education  HOME EXERCISE PROGRAM: Access Code: [email or web address] URL: [email or web address] Exercises - Supine Quad Set  - 2-3 x daily - 7 x weekly - 1 sets - 10 reps - 3s hold - Lying Prone  - 2-3 x daily - 7 x weekly - 3-5 min hold - Prone Gluteal Sets  - 2-3 x daily - 7 x weekly - 1 sets - 10 reps - 3s hold - Prone Knee Flexion  - 2-3 x daily - 7 x weekly - 3 sets - 10 reps - Seated Hamstring Stretch  - 2-3 x daily - 7 x weekly - 2 sets - 5 breaths hold  ASSESSMENT:  CLINICAL IMPRESSION:  The patient had mild limitations in end range flexion and ER today but had an improvement with manual therapy. We started him with weight bearing pre-running activity today. He had no significant pain. His RPE was low with all exercises. He will be 6 weeks next week. We will progress as tolerated.   Pt currently awaiting C.diff results.   REHAB POTENTIAL: Good  CLINICAL DECISION MAKING: Stable/uncomplicated  EVALUATION COMPLEXITY: Low   GOALS: Goals reviewed with patient? Yes  SHORT TERM GOALS: Target date: 4 weeks 5/19  Pain free flexion to 90 Baseline: Goal status:achieved 5/28   2.  Demo controlled quad set with hold Baseline:  Goal status: achived 5/28    3.  30s sit to stand without pain or compensation Baseline:  Goal status: INITIAL   4.  SLS 30s without compensation Baseline:  Goal status: INITIAL  LONG TERM GOALS:  Able to demo step up on 6" step with level pelvis and proper form Baseline:  Goal status: INITIAL Week 8 6/16  2.  Will tolerate at least 3 min on elliptical, demonstrating good tolerance to repetitive weight bearing motion Baseline:  Goal status:  INITIAL  Week 8 6/16  3.  Demonstrate proper form in at least 10 continuous lunges without increased pain Baseline:  Goal status: INITIAL  Week 12 7/14  4.  Demonstrate gentle, double and single foot plyometric motions with good proximal form Baseline:  Goal status: INITIAL Week 12 7/14  5.  Further functional goals to be set at POC date    PLAN:  PT FREQUENCY: 1-2x/week- 2 week gap as he will be finishing finals at school.   PT DURATION: POC date  PLANNED INTERVENTIONS: 97164- PT Re-evaluation, 97750- Physical Performance Testing, 97110-Therapeutic exercises, 97530- Therapeutic activity, [id number]- Neuromuscular re-education, [phone]- Self Care, 60454- Manual therapy, [phone]- Gait training, [phone]- Aquatic Therapy, Patient/Family education, Balance training, Stair training, Taping, Dry Needling, Joint mobilization, Spinal mobilization, Scar mobilization, Cryotherapy, and Moist heat.  PLAN FOR NEXT SESSION: per protocol  Silver Dross PT, DPT 01/26/24 8:11 AM

## 2024-01-28 ENCOUNTER — Encounter (HOSPITAL_BASED_OUTPATIENT_CLINIC_OR_DEPARTMENT_OTHER): Admitting: Physical Therapy

## 2024-02-02 ENCOUNTER — Ambulatory Visit (HOSPITAL_BASED_OUTPATIENT_CLINIC_OR_DEPARTMENT_OTHER): Attending: Orthopaedic Surgery | Admitting: Physical Therapy

## 2024-02-02 ENCOUNTER — Encounter (HOSPITAL_BASED_OUTPATIENT_CLINIC_OR_DEPARTMENT_OTHER): Payer: Self-pay | Admitting: Physical Therapy

## 2024-02-02 DIAGNOSIS — R262 Difficulty in walking, not elsewhere classified: Secondary | ICD-10-CM | POA: Insufficient documentation

## 2024-02-02 DIAGNOSIS — M25551 Pain in right hip: Secondary | ICD-10-CM | POA: Insufficient documentation

## 2024-02-02 DIAGNOSIS — M6281 Muscle weakness (generalized): Secondary | ICD-10-CM | POA: Insufficient documentation

## 2024-02-02 NOTE — Therapy (Unsigned)
 OUTPATIENT PHYSICAL THERAPY EVALUATION   Patient Name: Shawn Novak MRN: [id number] DOB:[birth year], 18 y.o., male Today's Date: 02/03/2024  END OF SESSION:  PT End of Session - 02/03/24 0810     Visit Number 6    Number of Visits 20    Date for PT Re-Evaluation 03/18/24    Authorization Type [employer] healthy blue    PT Start Time 0930    PT Stop Time 1012    PT Time Calculation (min) 42 min    Activity Tolerance Patient tolerated treatment well    Behavior During Therapy [facility] for tasks assessed/performed               Past Medical History:  Diagnosis Date   Ulcerative colitis (HCC)    Past Surgical History:  Procedure Laterality Date   BIOPSY  02/15/2023   Procedure: BIOPSY;  Surgeon: Daina Drum, MD;  Location: [facility];  Service: Gastroenterology;;   Milta Alosa SIGMOIDOSCOPY N/A 02/15/2023   Procedure: Marlynn Singer;  Surgeon: Daina Drum, MD;  Location: [facility];  Service: Gastroenterology;  Laterality: N/A;   Patient Active Problem List   Diagnosis Date Noted   Femoroacetabular impingement of right hip 12/20/2023   Labral tear of right hip joint 11/04/2023   Dietary counseling and surveillance 02/25/2023   Ulcerative colitis (HCC) 02/15/2023   Severe diarrhea 02/14/2023   Stress fracture of fibula 02/05/2022   Strain of calf muscle 01/15/2021  Days since surgery: 24    REFERRING PROVIDER:  Wilhelmenia Harada, MD     REFERRING DIAG:  M25.859 (ICD-10-CM) - Femoroacetabular impingement    s/p Rt hip labral repair  Rationale for Evaluation and Treatment: Rehabilitation  THERAPY DIAG:  Pain in right hip  Muscle weakness (generalized)  Difficulty in walking, not elsewhere classified  ONSET DATE: DOS 12/20/23  Days since surgery: 44    SUBJECTIVE:                                                                                                                                                                                            SUBJECTIVE STATEMENT:  The patient continues to have no pain. He has been tolerating his exercises well.    PERTINENT HISTORY:  none  PAIN:  Are you having pain? No  PRECAUTIONS:  None  RED FLAGS: None   WEIGHT BEARING RESTRICTIONS:  WBAT  FALLS:  Has patient fallen in last 6 months? No  LIVING ENVIRONMENT: Stairs in home  OCCUPATION:  Student  PLOF:  Independent  PATIENT GOALS:  Back to normal   OBJECTIVE:  Note: Objective measures were completed at Evaluation unless  otherwise noted.  PATIENT SURVEYS:  LEFS 19/80  COGNITIVE STATUS: Within functional limits for tasks assessed   SENSATION: WFL   GAIT: EVAl: arrived on bil axillary crutches   Body Part #1 Hip  PALPATION: EVAL: guarding with PROM, WFL end feels  EVAL ROM: hip flexion to 80, abd to 30 EVAL strength: able to demo quad set with hold                                                                                                                             TREATMENT DATE:  6/4  There-ex:  Assessed PROM to obtain seat position for leg press.   Ex bike 5 min   Leg press 70 lbs x12 RPE of 2  110 2x12 RPE of 5   Knee extension 1st set 25 lbs 1x12 RPE of 3  2x12 40 lbs RPE of 6    Neuro re-ed  Cable walk 20 lbs 3x RPE of 3  35 lbs RPE of 5 2x5   DL from elevated surface 26 lb KB RPE kept low 3x12   TRX squat 3x12. No restriction felt with deep squat     5/28  Manual:  Trigger point release to anterior hip Grade II and III inferior and posterior glides. Noticeable improvement in hip flexion   There-ex:  Bent knee fall out x15  Prone ER/IR x20  Prone hip extension 2lbs   Neuro re-ed:  Bridge x20 blue band  SL hip abduction with abdominal bracing green 2x15   Step onto air-ex fwd and lateral 2x15   Step and drive 4 inch 4U98 bilateral  There-act:  Squat to table 2x15       5/23  Upright bike warm up 5 min  Hip IR/ER on stool 3x10 Hip flexor  stretch in standing 30s 3x (gentle) Tall table DL squat 3x8 Half kneeling chop 10lb KB 3x10 each way Figure 4 stretch 30s 3x Standing march 20x Blue TB TKE 3x10   5/21 Manual: reviewed roller to anterior hip and self soft tissue mobilization   There-ex:  PROM into flexion, ER and IR   Prone SLR x12 RPE of 3  2x12 1.5 lbs RPE of 5   Prone ER/IR 2x15  Bent knee fall outs 2x10   Neuro-re -ed Quadruped rock x12  Side lying clmashell red 3x12 red RPE of 5   5/14  R hip PROM to tolerance Hip LAD with ABD   Thomas stretch off table 30s 3x Bridge 3x10 Supine butterfly stretch 30s 3x Retro walk 33ft 3x  Hip IR/ER 20x  Exercise safety, upright bike for cardio 20 min low to mod intensity, safety and precautions  4/24: Changed bandages See HEP   PATIENT EDUCATION:  Education details: Anatomy of condition, POC, HEP, exercise form/rationale Person educated: Patient and Parent Education method: Explanation, Demonstration, Tactile cues, Verbal cues, and Handouts Education comprehension: verbalized understanding, returned demonstration, verbal cues required, tactile cues required, and needs  further education  HOME EXERCISE PROGRAM: Access Code: [email or web address] URL: [email or web address] Exercises - Supine Quad Set  - 2-3 x daily - 7 x weekly - 1 sets - 10 reps - 3s hold - Lying Prone  - 2-3 x daily - 7 x weekly - 3-5 min hold - Prone Gluteal Sets  - 2-3 x daily - 7 x weekly - 1 sets - 10 reps - 3s hold - Prone Knee Flexion  - 2-3 x daily - 7 x weekly - 3 sets - 10 reps - Seated Hamstring Stretch  - 2-3 x daily - 7 x weekly - 2 sets - 5 breaths hold  ASSESSMENT:  CLINICAL IMPRESSION:  Therapy advanced patient into loaded LE exercises today. We loaded him keeping his RPE under 6. He tolerated well. He had no pain. He did really well with his deep bending exercises. He will start some of this on his own. .     Pt currently awaiting C.diff results.   REHAB  POTENTIAL: Good  CLINICAL DECISION MAKING: Stable/uncomplicated  EVALUATION COMPLEXITY: Low   GOALS: Goals reviewed with patient? Yes  SHORT TERM GOALS: Target date: 4 weeks 5/19  Pain free flexion to 90 Baseline: Goal status:achieved 6/4 5/28   2.  Demo controlled quad set with hold Baseline:  Goal status: achived 5/28    3.  30s sit to stand without pain or compensation Baseline:  Goal status: Ino problems 6/4   4.  SLS 30s without compensation Baseline:  Goal status: INITIAL  LONG TERM GOALS:  Able to demo step up on 6" step with level pelvis and proper form Baseline:  Goal status: INITIAL Week 8 6/16  2.  Will tolerate at least 3 min on elliptical, demonstrating good tolerance to repetitive weight bearing motion Baseline:  Goal status: INITIAL  Week 8 6/16  3.  Demonstrate proper form in at least 10 continuous lunges without increased pain Baseline:  Goal status: INITIAL  Week 12 7/14  4.  Demonstrate gentle, double and single foot plyometric motions with good proximal form Baseline:  Goal status: INITIAL Week 12 7/14  5.  Further functional goals to be set at POC date    PLAN:  PT FREQUENCY: 1-2x/week- 2 week gap as he will be finishing finals at school.   PT DURATION: POC date  PLANNED INTERVENTIONS: 97164- PT Re-evaluation, 97750- Physical Performance Testing, 97110-Therapeutic exercises, 97530- Therapeutic activity, [id number]- Neuromuscular re-education, [phone]- Self Care, 57846- Manual therapy, [phone]- Gait training, [phone]- Aquatic Therapy, Patient/Family education, Balance training, Stair training, Taping, Dry Needling, Joint mobilization, Spinal mobilization, Scar mobilization, Cryotherapy, and Moist heat.  PLAN FOR NEXT SESSION: per protocol  Signa Drier PT DPT 02/03/24 8:12 AM

## 2024-02-03 ENCOUNTER — Encounter (HOSPITAL_BASED_OUTPATIENT_CLINIC_OR_DEPARTMENT_OTHER): Payer: Self-pay | Admitting: Physical Therapy

## 2024-02-04 ENCOUNTER — Ambulatory Visit (HOSPITAL_BASED_OUTPATIENT_CLINIC_OR_DEPARTMENT_OTHER): Admitting: Physical Therapy

## 2024-02-04 ENCOUNTER — Encounter (HOSPITAL_BASED_OUTPATIENT_CLINIC_OR_DEPARTMENT_OTHER): Payer: Self-pay | Admitting: Physical Therapy

## 2024-02-04 DIAGNOSIS — R262 Difficulty in walking, not elsewhere classified: Secondary | ICD-10-CM

## 2024-02-04 DIAGNOSIS — M25551 Pain in right hip: Secondary | ICD-10-CM

## 2024-02-04 DIAGNOSIS — M6281 Muscle weakness (generalized): Secondary | ICD-10-CM

## 2024-02-04 NOTE — Therapy (Signed)
 OUTPATIENT PHYSICAL THERAPY EVALUATION   Patient Name: Shawn Novak MRN: [id number] DOB:2005/12/24, 18 y.o., male Today's Date: 02/04/2024  END OF SESSION:  PT End of Session - 02/04/24 0932     Visit Number 7    Number of Visits 20    Date for PT Re-Evaluation 03/18/24    Authorization Type [employer]    PT Start Time 0931    PT Stop Time 1013    PT Time Calculation (min) 42 min    Activity Tolerance Patient tolerated treatment well    Behavior During Therapy [facility] for tasks assessed/performed                Past Medical History:  Diagnosis Date   Ulcerative colitis (HCC)    Past Surgical History:  Procedure Laterality Date   BIOPSY  02/15/2023   Procedure: BIOPSY;  Surgeon: Daina Drum, MD;  Location: [facility];  Service: Gastroenterology;;   Milta Alosa SIGMOIDOSCOPY N/A 02/15/2023   Procedure: Marlynn Singer;  Surgeon: Daina Drum, MD;  Location: [facility];  Service: Gastroenterology;  Laterality: N/A;   Patient Active Problem List   Diagnosis Date Noted   Femoroacetabular impingement of right hip 12/20/2023   Labral tear of right hip joint 11/04/2023   Dietary counseling and surveillance 02/25/2023   Ulcerative colitis (HCC) 02/15/2023   Severe diarrhea 02/14/2023   Stress fracture of fibula 02/05/2022   Strain of calf muscle 01/15/2021  Days since surgery: 79    REFERRING PROVIDER:  Wilhelmenia Harada, MD     REFERRING DIAG:  M25.859 (ICD-10-CM) - Femoroacetabular impingement    s/p Rt hip labral repair  Rationale for Evaluation and Treatment: Rehabilitation  THERAPY DIAG:  Pain in right hip  Muscle weakness (generalized)  Difficulty in walking, not elsewhere classified  ONSET DATE: DOS 12/20/23  Days since surgery: 46    SUBJECTIVE:                                                                                                                                                                                            SUBJECTIVE STATEMENT:  Pt states that the hip feels good. He was not sore from last session. Only time there is pain is if he turns on it. Pt no longer has pain at work with his normal activity outside of lifting tasks require. Pt is 6 wks out.    PERTINENT HISTORY:  none  PAIN:  Are you having pain? No  PRECAUTIONS:  None  RED FLAGS: None   WEIGHT BEARING RESTRICTIONS:  WBAT  FALLS:  Has patient fallen in last 6 months? No  LIVING ENVIRONMENT: Stairs in home  OCCUPATION:  Student  PLOF:  Independent  PATIENT GOALS:  Back to normal   OBJECTIVE:  Note: Objective measures were completed at Evaluation unless otherwise noted.  PATIENT SURVEYS:  LEFS 19/80  COGNITIVE STATUS: Within functional limits for tasks assessed   SENSATION: WFL   GAIT: EVAl: arrived on bil axillary crutches   Body Part #1 Hip  PALPATION: EVAL: guarding with PROM, WFL end feels  EVAL ROM: hip flexion to 80, abd to 30 EVAL strength: able to demo quad set with hold                                                                                                                             TREATMENT DATE:   6/6  Keiser bike warm up 6 min, gear 10  YTB hip flexor march supine 2x20 YTB sidestepping at toe box 64ft 3x Runner's step up 8" L and R 6" lateral step down R 2x10 Supine hip 90/90 IR/ER 3x10    6/4  There-ex:  Assessed PROM to obtain seat position for leg press.   Ex bike 5 min   Leg press 70 lbs x12 RPE of 2  110 2x12 RPE of 5   Knee extension 1st set 25 lbs 1x12 RPE of 3  2x12 40 lbs RPE of 6    Neuro re-ed  Cable walk 20 lbs 3x RPE of 3  35 lbs RPE of 5 2x5   DL from elevated surface 26 lb KB RPE kept low 3x12   TRX squat 3x12. No restriction felt with deep squat     5/28  Manual:  Trigger point release to anterior hip Grade II and III inferior and posterior glides. Noticeable improvement in hip flexion   There-ex:  Bent knee fall out  x15  Prone ER/IR x20  Prone hip extension 2lbs   Neuro re-ed:  Bridge x20 blue band  SL hip abduction with abdominal bracing green 2x15   Step onto air-ex fwd and lateral 2x15   Step and drive 4 inch 4U98 bilateral  There-act:  Squat to table 2x15       5/23  Upright bike warm up 5 min  Hip IR/ER on stool 3x10 Hip flexor stretch in standing 30s 3x (gentle) Tall table DL squat 3x8 Half kneeling chop 10lb KB 3x10 each way Figure 4 stretch 30s 3x Standing march 20x Blue TB TKE 3x10   5/21 Manual: reviewed roller to anterior hip and self soft tissue mobilization   There-ex:  PROM into flexion, ER and IR   Prone SLR x12 RPE of 3  2x12 1.5 lbs RPE of 5   Prone ER/IR 2x15  Bent knee fall outs 2x10   Neuro-re -ed Quadruped rock x12  Side lying clmashell red 3x12 red RPE of 5   5/14  R hip PROM to tolerance Hip LAD with ABD   Thomas stretch off table 30s 3x Bridge 3x10  Supine butterfly stretch 30s 3x Retro walk 48ft 3x  Hip IR/ER 20x  Exercise safety, upright bike for cardio 20 min low to mod intensity, safety and precautions  4/24: Changed bandages See HEP   PATIENT EDUCATION:  Education details: Anatomy of condition, POC, HEP, exercise form/rationale Person educated: Patient and Parent Education method: Explanation, Demonstration, Tactile cues, Verbal cues, and Handouts Education comprehension: verbalized understanding, returned demonstration, verbal cues required, tactile cues required, and needs further education  HOME EXERCISE PROGRAM: Access Code: [email or web address] URL: [email or web address] Exercises - Supine Quad Set  - 2-3 x daily - 7 x weekly - 1 sets - 10 reps - 3s hold - Lying Prone  - 2-3 x daily - 7 x weekly - 3-5 min hold - Prone Gluteal Sets  - 2-3 x daily - 7 x weekly - 1 sets - 10 reps - 3s hold - Prone Knee Flexion  - 2-3 x daily - 7 x weekly - 3 sets - 10 reps - Seated Hamstring Stretch  - 2-3 x daily - 7 x weekly - 2  sets - 5 breaths hold  ASSESSMENT:  CLINICAL IMPRESSION: Pt 6 wks post op at this time and able to progress per prtocol with hip strengthening, particularly hip flexors, and SL stability/motor control. Progressive resistance exercise updated. ROM improves with new rotational stretching to reduce pain with turning while walking. No pain during session. Plan to continue as per protocol. Pt advised to progress RPE of knee extension and leg pressing in the gym setting. May continue with elliptical for cardio but advised not to sprint on machine at this time.   REHAB POTENTIAL: Good  CLINICAL DECISION MAKING: Stable/uncomplicated  EVALUATION COMPLEXITY: Low   GOALS: Goals reviewed with patient? Yes  SHORT TERM GOALS: Target date: 4 weeks 5/19  Pain free flexion to 90 Baseline: Goal status:achieved 6/4 5/28   2.  Demo controlled quad set with hold Baseline:  Goal status: achived 5/28    3.  30s sit to stand without pain or compensation Baseline:  Goal status: Ino problems 6/4   4.  SLS 30s without compensation Baseline:  Goal status: INITIAL  LONG TERM GOALS:  Able to demo step up on 6" step with level pelvis and proper form Baseline:  Goal status: INITIAL Week 8 6/16  2.  Will tolerate at least 3 min on elliptical, demonstrating good tolerance to repetitive weight bearing motion Baseline:  Goal status: INITIAL  Week 8 6/16  3.  Demonstrate proper form in at least 10 continuous lunges without increased pain Baseline:  Goal status: INITIAL  Week 12 7/14  4.  Demonstrate gentle, double and single foot plyometric motions with good proximal form Baseline:  Goal status: INITIAL Week 12 7/14  5.  Further functional goals to be set at POC date    PLAN:  PT FREQUENCY: 1-2x/week- 2 week gap as he will be finishing finals at school.   PT DURATION: POC date  PLANNED INTERVENTIONS: 97164- PT Re-evaluation, 97750- Physical Performance Testing, 97110-Therapeutic exercises,  97530- Therapeutic activity, [id number]- Neuromuscular re-education, [phone]- Self Care, 60454- Manual therapy, [phone]- Gait training, [phone]- Aquatic Therapy, Patient/Family education, Balance training, Stair training, Taping, Dry Needling, Joint mobilization, Spinal mobilization, Scar mobilization, Cryotherapy, and Moist heat.  PLAN FOR NEXT SESSION: per protocol  Silver Dross PT, DPT 02/04/24 10:14 AM

## 2024-02-09 ENCOUNTER — Encounter (HOSPITAL_BASED_OUTPATIENT_CLINIC_OR_DEPARTMENT_OTHER): Payer: Self-pay | Admitting: Physical Therapy

## 2024-02-09 ENCOUNTER — Ambulatory Visit (HOSPITAL_BASED_OUTPATIENT_CLINIC_OR_DEPARTMENT_OTHER): Admitting: Physical Therapy

## 2024-02-09 DIAGNOSIS — R262 Difficulty in walking, not elsewhere classified: Secondary | ICD-10-CM

## 2024-02-09 DIAGNOSIS — M25551 Pain in right hip: Secondary | ICD-10-CM | POA: Diagnosis not present

## 2024-02-09 DIAGNOSIS — M6281 Muscle weakness (generalized): Secondary | ICD-10-CM

## 2024-02-09 NOTE — Therapy (Signed)
 OUTPATIENT PHYSICAL THERAPY EVALUATION   Patient Name: Shawn Novak MRN: [id number] DOB:[birth year], 18 y.o., male Today's Date: 02/09/2024  END OF SESSION:  PT End of Session - 02/09/24 0948     Visit Number 8    Number of Visits 20    Date for PT Re-Evaluation 03/18/24    Authorization Type [employer]    PT Start Time 0945   late arrival   PT Stop Time 1015    PT Time Calculation (min) 30 min    Activity Tolerance Patient tolerated treatment well    Behavior During Therapy [facility] for tasks assessed/performed                Past Medical History:  Diagnosis Date   Ulcerative colitis (HCC)    Past Surgical History:  Procedure Laterality Date   BIOPSY  02/15/2023   Procedure: BIOPSY;  Surgeon: Daina Drum, MD;  Location: [facility];  Service: Gastroenterology;;   Milta Alosa SIGMOIDOSCOPY N/A 02/15/2023   Procedure: Marlynn Singer;  Surgeon: Daina Drum, MD;  Location: [facility];  Service: Gastroenterology;  Laterality: N/A;   Patient Active Problem List   Diagnosis Date Noted   Femoroacetabular impingement of right hip 12/20/2023   Labral tear of right hip joint 11/04/2023   Dietary counseling and surveillance 02/25/2023   Ulcerative colitis (HCC) 02/15/2023   Severe diarrhea 02/14/2023   Stress fracture of fibula 02/05/2022   Strain of calf muscle 01/15/2021  Days since surgery: 20    REFERRING PROVIDER:  Wilhelmenia Harada, MD     REFERRING DIAG:  M25.859 (ICD-10-CM) - Femoroacetabular impingement    s/p Rt hip labral repair  Rationale for Evaluation and Treatment: Rehabilitation  THERAPY DIAG:  Pain in right hip  Muscle weakness (generalized)  Difficulty in walking, not elsewhere classified  ONSET DATE: DOS 12/20/23  Days since surgery: 51    SUBJECTIVE:                                                                                                                                                                                            SUBJECTIVE STATEMENT:  Pt is 7 wks at this time. No complaints with previous HEP. Is progressing with cardio on elliptical 2x/week. No pain other than with a turn- inconsistent pattern.    PERTINENT HISTORY:  none  PAIN:  Are you having pain? No  PRECAUTIONS:  None  RED FLAGS: None   WEIGHT BEARING RESTRICTIONS:  WBAT  FALLS:  Has patient fallen in last 6 months? No  LIVING ENVIRONMENT: Stairs in home  OCCUPATION:  Student  PLOF:  Independent  PATIENT GOALS:  Back to normal   OBJECTIVE:  Note: Objective measures were completed at Evaluation unless otherwise noted.  PATIENT SURVEYS:  LEFS 19/80  COGNITIVE STATUS: Within functional limits for tasks assessed   SENSATION: WFL   GAIT: EVAl: arrived on bil axillary crutches   Body Part #1 Hip  PALPATION: EVAL: guarding with PROM, WFL end feels  EVAL ROM: hip flexion to 80, abd to 30 EVAL strength: able to demo quad set with hold                                                                                                                             TREATMENT DATE:   6/11 Keiser bike warm up 6 min, gear 10  25lb goblet with heel prop 2x8 Box squat 95lbs 2s pause 18 box 2x8 RDL 95lbs 2x8 Bulg SS 2x10 SL RDL 2x10 Standing hip flexor march   6/6  Keiser bike warm up 6 min, gear 10  YTB hip flexor march supine 2x20 YTB sidestepping at toe box 76ft 3x Runner's step up 8 L and R 6 lateral step down R 2x10 Supine hip 90/90 IR/ER 3x10    6/4  There-ex:  Assessed PROM to obtain seat position for leg press.   Ex bike 5 min   Leg press 70 lbs x12 RPE of 2  110 2x12 RPE of 5   Knee extension 1st set 25 lbs 1x12 RPE of 3  2x12 40 lbs RPE of 6    Neuro re-ed  Cable walk 20 lbs 3x RPE of 3  35 lbs RPE of 5 2x5   DL from elevated surface 26 lb KB RPE kept low 3x12   TRX squat 3x12. No restriction felt with deep squat     5/28  Manual:  Trigger point  release to anterior hip Grade II and III inferior and posterior glides. Noticeable improvement in hip flexion   There-ex:  Bent knee fall out x15  Prone ER/IR x20  Prone hip extension 2lbs   Neuro re-ed:  Bridge x20 blue band  SL hip abduction with abdominal bracing green 2x15   Step onto air-ex fwd and lateral 2x15   Step and drive 4 inch 4N82 bilateral  There-act:  Squat to table 2x15       5/23  Upright bike warm up 5 min  Hip IR/ER on stool 3x10 Hip flexor stretch in standing 30s 3x (gentle) Tall table DL squat 3x8 Half kneeling chop 10lb KB 3x10 each way Figure 4 stretch 30s 3x Standing march 20x Blue TB TKE 3x10   5/21 Manual: reviewed roller to anterior hip and self soft tissue mobilization   There-ex:  PROM into flexion, ER and IR   Prone SLR x12 RPE of 3  2x12 1.5 lbs RPE of 5   Prone ER/IR 2x15  Bent knee fall outs 2x10   Neuro-re -ed Quadruped rock x12  Side lying clmashell red 3x12 red RPE  of 5   5/14  R hip PROM to tolerance Hip LAD with ABD   Thomas stretch off table 30s 3x Bridge 3x10 Supine butterfly stretch 30s 3x Retro walk 50ft 3x  Hip IR/ER 20x  Exercise safety, upright bike for cardio 20 min low to mod intensity, safety and precautions  4/24: Changed bandages See HEP   PATIENT EDUCATION:  Education details: Anatomy of condition, POC, HEP, exercise form/rationale Person educated: Patient and Parent Education method: Explanation, Demonstration, Tactile cues, Verbal cues, and Handouts Education comprehension: verbalized understanding, returned demonstration, verbal cues required, tactile cues required, and needs further education  HOME EXERCISE PROGRAM: Access Code: [email or web address] URL: [email or web address] Exercises - Supine Quad Set  - 2-3 x daily - 7 x weekly - 1 sets - 10 reps - 3s hold - Lying Prone  - 2-3 x daily - 7 x weekly - 3-5 min hold - Prone Gluteal Sets  - 2-3 x daily - 7 x weekly - 1 sets - 10  reps - 3s hold - Prone Knee Flexion  - 2-3 x daily - 7 x weekly - 3 sets - 10 reps - Seated Hamstring Stretch  - 2-3 x daily - 7 x weekly - 2 sets - 5 breaths hold  ASSESSMENT:  CLINICAL IMPRESSION: Pt 7 wks post op at this time and able to progress per prtocol with hip strengthening and progressive loading throughout ROM. Pt with good tolerance to all exercise with modified loading positions with squatting. Lack of motor control as expected with SL exercise. No pain noted during session. Full depth squat achieved without pain but pt does have quad dominant pattern vs hip hinging. Plan to continue with ROM and progressive loading as per protocol consider side planking, weighted step ups, and supported lunging progression at next. Pt at full 120 hip flexion and 5 deg of hip ext today. IR/ER WFL during squatting position.   REHAB POTENTIAL: Good  CLINICAL DECISION MAKING: Stable/uncomplicated  EVALUATION COMPLEXITY: Low   GOALS: Goals reviewed with patient? Yes  SHORT TERM GOALS: Target date: 4 weeks 5/19  Pain free flexion to 90 Baseline: Goal status:achieved 6/4 5/28   2.  Demo controlled quad set with hold Baseline:  Goal status: achived 5/28    3.  30s sit to stand without pain or compensation Baseline:  Goal status: Ino problems 6/4   4.  SLS 30s without compensation Baseline:  Goal status: INITIAL  LONG TERM GOALS:  Able to demo step up on 6 step with level pelvis and proper form Baseline:  Goal status: INITIAL Week 8 6/16  2.  Will tolerate at least 3 min on elliptical, demonstrating good tolerance to repetitive weight bearing motion Baseline:  Goal status: INITIAL  Week 8 6/16  3.  Demonstrate proper form in at least 10 continuous lunges without increased pain Baseline:  Goal status: INITIAL  Week 12 7/14  4.  Demonstrate gentle, double and single foot plyometric motions with good proximal form Baseline:  Goal status: INITIAL Week 12 7/14  5.  Further  functional goals to be set at POC date    PLAN:  PT FREQUENCY: 1-2x/week- 2 week gap as he will be finishing finals at school.   PT DURATION: POC date  PLANNED INTERVENTIONS: 97164- PT Re-evaluation, 97750- Physical Performance Testing, 97110-Therapeutic exercises, 97530- Therapeutic activity, [id number]- Neuromuscular re-education, [phone]- Self Care, 60454- Manual therapy, [phone]- Gait training, [phone]- Aquatic Therapy, Patient/Family education, Balance training, Stair training, Taping, Dry Needling, Joint mobilization, Spinal mobilization,  Scar mobilization, Cryotherapy, and Moist heat.  PLAN FOR NEXT SESSION: per protocol  Silver Dross PT, DPT 02/09/24 10:20 AM

## 2024-02-11 ENCOUNTER — Ambulatory Visit (HOSPITAL_BASED_OUTPATIENT_CLINIC_OR_DEPARTMENT_OTHER): Admitting: Physical Therapy

## 2024-02-11 ENCOUNTER — Encounter (HOSPITAL_BASED_OUTPATIENT_CLINIC_OR_DEPARTMENT_OTHER): Payer: Self-pay | Admitting: Physical Therapy

## 2024-02-11 DIAGNOSIS — R262 Difficulty in walking, not elsewhere classified: Secondary | ICD-10-CM

## 2024-02-11 DIAGNOSIS — M25551 Pain in right hip: Secondary | ICD-10-CM

## 2024-02-11 DIAGNOSIS — M6281 Muscle weakness (generalized): Secondary | ICD-10-CM

## 2024-02-11 NOTE — Therapy (Signed)
 OUTPATIENT PHYSICAL THERAPY EVALUATION   Patient Name: Shawn Novak MRN: [id number] DOB:2006-04-17, 18 y.o., male Today's Date: 02/11/2024  END OF SESSION:  PT End of Session - 02/11/24 1008     Visit Number 9    Number of Visits 20    Date for PT Re-Evaluation 03/18/24    Authorization Type M[employer] healthy blue    PT Start Time 0930    PT Stop Time 1010    PT Time Calculation (min) 40 min    Activity Tolerance Patient tolerated treatment well    Behavior During Therapy [facility] for tasks assessed/performed              Past Medical History:  Diagnosis Date   Ulcerative colitis (HCC)    Past Surgical History:  Procedure Laterality Date   BIOPSY  02/15/2023   Procedure: BIOPSY;  Surgeon: Daina Drum, MD;  Location: [facility];  Service: Gastroenterology;;   [facility] Alosa SIGMOIDOSCOPY N/A 02/15/2023   Procedure: Marlynn Singer;  Surgeon: Daina Drum, MD;  Location: [facility];  Service: Gastroenterology;  Laterality: N/A;   Patient Active Problem List   Diagnosis Date Noted   Femoroacetabular impingement of right hip 12/20/2023   Labral tear of right hip joint 11/04/2023   Dietary counseling and surveillance 02/25/2023   Ulcerative colitis (HCC) 02/15/2023   Severe diarrhea 02/14/2023   Stress fracture of fibula 02/05/2022   Strain of calf muscle 01/15/2021  Days since surgery: 31    REFERRING PROVIDER:  Wilhelmenia Harada, MD     REFERRING DIAG:  M25.859 (ICD-10-CM) - Femoroacetabular impingement    s/p Rt hip labral repair  Rationale for Evaluation and Treatment: Rehabilitation  THERAPY DIAG:  Pain in right hip  Muscle weakness (generalized)  Difficulty in walking, not elsewhere classified  ONSET DATE: DOS 12/20/23  Days since surgery: 53    SUBJECTIVE:                                                                                                                                                                                            SUBJECTIVE STATEMENT:  Pt is 7 wks at this time. No complaints from last session. No pain. Has been compliant with HEP. Has not had turning pain since last session.   PERTINENT HISTORY:  none  PAIN:  Are you having pain? No  PRECAUTIONS:  None  RED FLAGS: None   WEIGHT BEARING RESTRICTIONS:  WBAT  FALLS:  Has patient fallen in last 6 months? No  LIVING ENVIRONMENT: Stairs in home  OCCUPATION:  Student  PLOF:  Independent  PATIENT GOALS:  Back to normal  OBJECTIVE:  Note: Objective measures were completed at Evaluation unless otherwise noted.  PATIENT SURVEYS:  LEFS 19/80  COGNITIVE STATUS: Within functional limits for tasks assessed   SENSATION: WFL   GAIT: EVAl: arrived on bil axillary crutches   Body Part #1 Hip  PALPATION: EVAL: guarding with PROM, WFL end feels  EVAL ROM: hip flexion to 80, abd to 30 EVAL strength: able to demo quad set with hold                                                                                                                             TREATMENT DATE:   6/13  Keiser bike warm up 6 min, gear 10 Self banded hip mob IR/ER, flexion, and ext   135lb rack pull 3x8 (mid thigh)  95lb RDL warm up Box squat 95lbs 2s pause 18 box 3x8 Sideplank 5s 2x10 Hip airplane 2x10 (holding rack) UE supported alternating lunge 2x10    6/11 Keiser bike warm up 6 min, gear 10  25lb goblet with heel prop 2x8 Box squat 95lbs 2s pause 18 box 2x8 RDL 95lbs 2x8 Bulg SS 2x10 SL RDL 2x10 Standing hip flexor march   6/6  Keiser bike warm up 6 min, gear 10  YTB hip flexor march supine 2x20 YTB sidestepping at toe box 34ft 3x Runner's step up 8 L and R 6 lateral step down R 2x10 Supine hip 90/90 IR/ER 3x10    6/4  There-ex:  Assessed PROM to obtain seat position for leg press.   Ex bike 5 min   Leg press 70 lbs x12 RPE of 2  110 2x12 RPE of 5   Knee extension 1st set 25 lbs 1x12 RPE of 3  2x12 40  lbs RPE of 6    Neuro re-ed  Cable walk 20 lbs 3x RPE of 3  35 lbs RPE of 5 2x5   DL from elevated surface 26 lb KB RPE kept low 3x12   TRX squat 3x12. No restriction felt with deep squat     5/28  Manual:  Trigger point release to anterior hip Grade II and III inferior and posterior glides. Noticeable improvement in hip flexion   There-ex:  Bent knee fall out x15  Prone ER/IR x20  Prone hip extension 2lbs   Neuro re-ed:  Bridge x20 blue band  SL hip abduction with abdominal bracing green 2x15   Step onto air-ex fwd and lateral 2x15   Step and drive 4 inch 1O10 bilateral  There-act:  Squat to table 2x15       5/23  Upright bike warm up 5 min  Hip IR/ER on stool 3x10 Hip flexor stretch in standing 30s 3x (gentle) Tall table DL squat 3x8 Half kneeling chop 10lb KB 3x10 each way Figure 4 stretch 30s 3x Standing march 20x Blue TB TKE 3x10   5/21 Manual: reviewed roller to anterior hip and self soft tissue mobilization   There-ex:  PROM into flexion, ER and IR   Prone SLR x12 RPE of 3  2x12 1.5 lbs RPE of 5   Prone ER/IR 2x15  Bent knee fall outs 2x10   Neuro-re -ed Quadruped rock x12  Side lying clmashell red 3x12 red RPE of 5   5/14  R hip PROM to tolerance Hip LAD with ABD   Thomas stretch off table 30s 3x Bridge 3x10 Supine butterfly stretch 30s 3x Retro walk 54ft 3x  Hip IR/ER 20x  Exercise safety, upright bike for cardio 20 min low to mod intensity, safety and precautions  4/24: Changed bandages See HEP   PATIENT EDUCATION:  Education details: Anatomy of condition, POC, HEP, exercise form/rationale Person educated: Patient and Parent Education method: Explanation, Demonstration, Tactile cues, Verbal cues, and Handouts Education comprehension: verbalized understanding, returned demonstration, verbal cues required, tactile cues required, and needs further education  HOME EXERCISE PROGRAM: Access Code: [email or web address] URL:  [email or web address] Exercises - Supine Quad Set  - 2-3 x daily - 7 x weekly - 1 sets - 10 reps - 3s hold - Lying Prone  - 2-3 x daily - 7 x weekly - 3-5 min hold - Prone Gluteal Sets  - 2-3 x daily - 7 x weekly - 1 sets - 10 reps - 3s hold - Prone Knee Flexion  - 2-3 x daily - 7 x weekly - 3 sets - 10 reps - Seated Hamstring Stretch  - 2-3 x daily - 7 x weekly - 2 sets - 5 breaths hold  ASSESSMENT:  CLINICAL IMPRESSION: Pt 7 wks post op at this time and able to continue with funcitonal strengthening phase of rehab. Pt able to start modified ROM lifting as well as SL motor control exercises are near end range without pain. Lunging and hip ext positions are still tight and stiff as expected but pt able to ease into full ext position without pain. Hip IR/ER self mobs provided for home. Pt advised to avoid aggressive hip ext stretching with overpressure still. Pt progressing very well. Consider manual belt mobs if stiffness increases with increase in exercise volume and intensity. Also consider SL activity on pliant surface with or without perturbation.   REHAB POTENTIAL: Good  CLINICAL DECISION MAKING: Stable/uncomplicated  EVALUATION COMPLEXITY: Low   GOALS: Goals reviewed with patient? Yes  SHORT TERM GOALS: Target date: 4 weeks 5/19  Pain free flexion to 90 Baseline: Goal status:achieved 6/4 5/28   2.  Demo controlled quad set with hold Baseline:  Goal status: achived 5/28    3.  30s sit to stand without pain or compensation Baseline:  Goal status: Ino problems 6/4   4.  SLS 30s without compensation Baseline:  Goal status: INITIAL  LONG TERM GOALS:  Able to demo step up on 6 step with level pelvis and proper form Baseline:  Goal status: INITIAL Week 8 6/16  2.  Will tolerate at least 3 min on elliptical, demonstrating good tolerance to repetitive weight bearing motion Baseline:  Goal status: INITIAL  Week 8 6/16  3.  Demonstrate proper form in at  least 10 continuous lunges without increased pain Baseline:  Goal status: INITIAL  Week 12 7/14  4.  Demonstrate gentle, double and single foot plyometric motions with good proximal form Baseline:  Goal status: INITIAL Week 12 7/14  5.  Further functional goals to be set at POC date    PLAN:  PT FREQUENCY: 1-2x/week- 2 week gap as he will be finishing finals at school.  PT DURATION: POC date  PLANNED INTERVENTIONS: 97164- PT Re-evaluation, 97750- Physical Performance Testing, 97110-Therapeutic exercises, 97530- Therapeutic activity, [id number]- Neuromuscular re-education, [phone]- Self Care, 19147- Manual therapy, [phone]- Gait training, [phone]- Aquatic Therapy, Patient/Family education, Balance training, Stair training, Taping, Dry Needling, Joint mobilization, Spinal mobilization, Scar mobilization, Cryotherapy, and Moist heat.  PLAN FOR NEXT SESSION: per protocol  Silver Dross PT, DPT 02/11/24 10:13 AM

## 2024-02-16 ENCOUNTER — Encounter (HOSPITAL_BASED_OUTPATIENT_CLINIC_OR_DEPARTMENT_OTHER): Admitting: Physical Therapy

## 2024-02-18 ENCOUNTER — Ambulatory Visit (HOSPITAL_BASED_OUTPATIENT_CLINIC_OR_DEPARTMENT_OTHER): Admitting: Physical Therapy

## 2024-02-18 ENCOUNTER — Encounter (HOSPITAL_BASED_OUTPATIENT_CLINIC_OR_DEPARTMENT_OTHER): Payer: Self-pay | Admitting: Physical Therapy

## 2024-02-23 ENCOUNTER — Encounter (HOSPITAL_BASED_OUTPATIENT_CLINIC_OR_DEPARTMENT_OTHER): Admitting: Physical Therapy

## 2024-02-25 ENCOUNTER — Ambulatory Visit (HOSPITAL_BASED_OUTPATIENT_CLINIC_OR_DEPARTMENT_OTHER): Admitting: Orthopaedic Surgery

## 2024-02-25 ENCOUNTER — Ambulatory Visit (HOSPITAL_BASED_OUTPATIENT_CLINIC_OR_DEPARTMENT_OTHER): Admitting: Physical Therapy

## 2024-02-25 DIAGNOSIS — M25851 Other specified joint disorders, right hip: Secondary | ICD-10-CM

## 2024-02-25 NOTE — Progress Notes (Signed)
 Post Operative Evaluation    Procedure/Date of Surgery: Right hip arthroscopy with cam debridement labral repair 4/21  Interval History:    Presents 6 weeks status post the above procedure.  Overall he is doing extremely well.  He has no pain with longer rides on the elliptical.  He is overall doing extremely well   PMH/PSH/Family History/Social History/Meds/Allergies:    Past Medical History:  Diagnosis Date   Ulcerative colitis [facility])    Past Surgical History:  Procedure Laterality Date   BIOPSY  02/15/2023   Procedure: BIOPSY;  Surgeon: Federico Rosario BROCKS, MD;  Location: [facility];  Service: Gastroenterology;;   [facility] SIGMOIDOSCOPY N/A 02/15/2023   Procedure: ENID MORIN;  Surgeon: Federico Rosario BROCKS, MD;  Location: [facility];  Service: Gastroenterology;  Laterality: N/A;   Social History   Socioeconomic History   Marital status: Single    Spouse name: Not on file   Number of children: Not on file   Years of education: Not on file   Highest education level: Not on file  Occupational History   Not on file  Tobacco Use   Smoking status: Never   Smokeless tobacco: Never  Vaping Use   Vaping status: Never Used  Substance and Sexual Activity   Alcohol use: No   Drug use: No   Sexual activity: Not on file  Other Topics Concern   Not on file  Social History Narrative   Not on file   Social Drivers of Health   Financial Resource Strain: Not on file  Food Insecurity: No Food Insecurity (02/15/2023)   Hunger Vital Sign    Worried About Running Out of Food in the Last Year: Never true    Ran Out of Food in the Last Year: Never true  Transportation Needs: No Transportation Needs (02/15/2023)   PRAPARE - Administrator, Civil Service (Medical): No    Lack of Transportation (Non-Medical): No  Physical Activity: Not on file  Stress: No Stress Concern Present (07/28/2023)   Received from [facility] of Occupational Health - Occupational Stress Questionnaire    Feeling of Stress : Not at all  Social Connections: Unknown (04/05/2023)   Received from [facility]   Social Network    Social Network: Not on file   Family History  Problem Relation Age of Onset   Ulcerative colitis Father    Allergic rhinitis Neg Hx    Angioedema Neg Hx    Asthma Neg Hx    Eczema Neg Hx    Urticaria Neg Hx    No Known Allergies Current Outpatient Medications  Medication Sig Dispense Refill   aspirin  EC 325 MG tablet Take 1 tablet (325 mg total) by mouth daily. 14 tablet 0   dicyclomine  (BENTYL ) 20 MG tablet Take 1 tablet (20 mg total) by mouth 3 (three) times daily as needed for spasms. 60 tablet 1   fluticasone  (FLONASE ) 50 MCG/ACT nasal spray Place 2 sprays into both nostrils daily for 7 days. (Patient not taking: Reported on 12/09/2023) 1 mL 2   mesalamine  (APRISO ) 0.375 g 24 hr capsule Take 4 capsules (1.5 g total) by mouth daily. (Patient not taking: Reported on 12/09/2023) 360 capsule 3   oxyCODONE  (ROXICODONE ) 5 MG immediate release tablet Take 1 tablet (5 mg  total) by mouth every 4 (four) hours as needed for severe pain (pain score 7-10) or breakthrough pain. 30 tablet 0   Upadacitinib  ER (RINVOQ ) 15 MG TB24 Take 1 tablet (15 mg total) by mouth daily. 30 tablet 2   No current facility-administered medications for this visit.   No results found.  Review of Systems:   A ROS was performed including pertinent positives and negatives as documented in the HPI.   Musculoskeletal Exam:    There were no vitals taken for this visit.  Right hip incisions are well-appearing without erythema or drainage.  Incision is from 30 degrees and external rotation to 30 degrees without pain.  Walks with a normal gait distal neurosensory exam is intact  Imaging:      I personally reviewed and interpreted the radiographs.   Assessment:   6-week status post right hip arthroscopy with labral repair  and cam debridement overall doing extremely well.  At this time he will be full activity and he may return to a jog progression to run.  Overall he is doing extremely well and I will plan to see him back as needed  Plan :    - Return to clinic as needed     I personally saw and evaluated the patient, and participated in the management and treatment plan.  Elspeth Parker, MD Attending Physician, Orthopedic Surgery  This document was dictated using Dragon voice recognition software. A reasonable attempt at proof reading has been made to minimize errors.

## 2024-02-26 ENCOUNTER — Ambulatory Visit (HOSPITAL_BASED_OUTPATIENT_CLINIC_OR_DEPARTMENT_OTHER): Admitting: Rehabilitative and Restorative Service Providers"

## 2024-02-28 ENCOUNTER — Encounter (HOSPITAL_BASED_OUTPATIENT_CLINIC_OR_DEPARTMENT_OTHER): Admitting: Physical Therapy

## 2024-03-01 ENCOUNTER — Encounter (HOSPITAL_BASED_OUTPATIENT_CLINIC_OR_DEPARTMENT_OTHER): Admitting: Physical Therapy

## 2024-03-06 ENCOUNTER — Encounter (HOSPITAL_BASED_OUTPATIENT_CLINIC_OR_DEPARTMENT_OTHER): Payer: Self-pay

## 2024-03-08 ENCOUNTER — Ambulatory Visit (HOSPITAL_BASED_OUTPATIENT_CLINIC_OR_DEPARTMENT_OTHER): Attending: Orthopaedic Surgery | Admitting: Physical Therapy

## 2024-03-08 ENCOUNTER — Encounter (HOSPITAL_BASED_OUTPATIENT_CLINIC_OR_DEPARTMENT_OTHER): Payer: Self-pay | Admitting: Physical Therapy

## 2024-03-08 DIAGNOSIS — R262 Difficulty in walking, not elsewhere classified: Secondary | ICD-10-CM | POA: Diagnosis present

## 2024-03-08 DIAGNOSIS — M25551 Pain in right hip: Secondary | ICD-10-CM | POA: Diagnosis present

## 2024-03-08 DIAGNOSIS — M6281 Muscle weakness (generalized): Secondary | ICD-10-CM | POA: Insufficient documentation

## 2024-03-08 NOTE — Therapy (Signed)
 OUTPATIENT PHYSICAL THERAPY EVALUATION   Patient Name: Shawn Novak MRN: [id number] DOB:[birth year], 18 y.o., male Today's Date: 03/08/2024  END OF SESSION:  PT End of Session - 03/08/24 0902     Visit Number 10    Number of Visits 20    Date for PT Re-Evaluation 06/06/24    Authorization Type MCD healthy blue    PT Start Time 0905    PT Stop Time 0945    PT Time Calculation (min) 40 min    Activity Tolerance Patient tolerated treatment well    Behavior During Therapy [facility] for tasks assessed/performed              Past Medical History:  Diagnosis Date   Ulcerative colitis [facility])    Past Surgical History:  Procedure Laterality Date   BIOPSY  02/15/2023   Procedure: BIOPSY;  Surgeon: Federico Rosario BROCKS, MD;  Location: [facility];  Service: Gastroenterology;;   [facility] SIGMOIDOSCOPY N/A 02/15/2023   Procedure: ENID MORIN;  Surgeon: Federico Rosario BROCKS, MD;  Location: [facility];  Service: Gastroenterology;  Laterality: N/A;   Patient Active Problem List   Diagnosis Date Noted   Femoroacetabular impingement of right hip 12/20/2023   Labral tear of right hip joint 11/04/2023   Dietary counseling and surveillance 02/25/2023   Ulcerative colitis (HCC) 02/15/2023   Severe diarrhea 02/14/2023   Stress fracture of fibula 02/05/2022   Strain of calf muscle 01/15/2021  Days since surgery: 80    REFERRING PROVIDER:  Genelle Standing, MD     REFERRING DIAG:  M25.859 (ICD-10-CM) - Femoroacetabular impingement    s/p Rt hip labral repair  Rationale for Evaluation and Treatment: Rehabilitation  THERAPY DIAG:  Pain in right hip - Plan: PT plan of care cert/re-cert  Muscle weakness (generalized) - Plan: PT plan of care cert/re-cert  Difficulty in walking, not elsewhere classified - Plan: PT plan of care cert/re-cert  ONSET DATE: DOS 12/20/23  Days since surgery: 79    SUBJECTIVE:                                                                                                                                                                                            SUBJECTIVE STATEMENT:  Pt is 11 wks at this time. Pt saw MD and was cleared to start jogging progression. Pt is doing a walk jog of 5x3 min on off. Pt is up to 5x3.   PERTINENT HISTORY:  none  PAIN:  Are you having pain? No  PRECAUTIONS:  None  RED FLAGS: None   WEIGHT BEARING RESTRICTIONS:  WBAT  FALLS:  Has patient fallen  in last 6 months? No  LIVING ENVIRONMENT: Stairs in home  OCCUPATION:  Student  PLOF:  Independent  PATIENT GOALS:  Back to normal   OBJECTIVE:  Note: Objective measures were completed at Evaluation unless otherwise noted.  PATIENT SURVEYS:  LEFS 19/80  LEFS: 74/80  COGNITIVE STATUS: Within functional limits for tasks assessed   SENSATION: WFL   GAIT: WNL    AROM Right 7/9 Left 7/9  Hip flexion 120 120  Hip extension 10 10  Hip abduction    Hip adduction    Hip internal rotation 35 35  Hip external rotation 45 45  Knee flexion    Knee extension    Ankle dorsiflexion    Ankle plantarflexion    Ankle inversion    Ankle eversion     (Blank rows = not tested)      MMT Right 7/9 Left 7/9  Hip flexion 61.1 p! 56.6 lbs  Hip extension 67.7 60.7  Hip abduction 72.5 82.4  Hip adduction 65.1 68.5  Hip internal rotation 37.7 34.2  Hip external rotation 42.0 38.3  Knee flexion    Knee extension 49.9 64.0  Ankle dorsiflexion    Ankle plantarflexion    Ankle inversion    Ankle eversion     (Blank rows = not tested)    Functional Tests: 20x 8 step down  - passed  Double leg AP and ML hops 30s: passed Single leg AP an ML hops 30s: passed Jogging technique analysis: decreased hip flexion, donkey kick, increased COG vertical displacement                                                                                                                           TREATMENT DATE:   7/9  A-skip doubles   Crossover hopping Power skips bounding bounds    Edu of exam results, HEP updates, return to exercise safety in weight room   6/13  Keiser bike warm up 6 min, gear 10 Self banded hip mob IR/ER, flexion, and ext   135lb rack pull 3x8 (mid thigh)  95lb RDL warm up Box squat 95lbs 2s pause 18 box 3x8 Sideplank 5s 2x10 Hip airplane 2x10 (holding rack) UE supported alternating lunge 2x10    6/11 Keiser bike warm up 6 min, gear 10  25lb goblet with heel prop 2x8 Box squat 95lbs 2s pause 18 box 2x8 RDL 95lbs 2x8 Bulg SS 2x10 SL RDL 2x10 Standing hip flexor march   6/6  Keiser bike warm up 6 min, gear 10  YTB hip flexor march supine 2x20 YTB sidestepping at toe box 54ft 3x Runner's step up 8 L and R 6 lateral step down R 2x10 Supine hip 90/90 IR/ER 3x10   PATIENT EDUCATION:  Education details: Anatomy of condition, POC, HEP, exercise form/rationale Person educated: Patient and Parent Education method: Explanation, Demonstration, Tactile cues, Verbal cues, and Handouts Education comprehension: verbalized understanding, returned demonstration, verbal cues required, tactile cues required, and needs  further education  HOME EXERCISE PROGRAM: Access Code: [email or web address] URL: [email or web address] Exercises - Supine Quad Set  - 2-3 x daily - 7 x weekly - 1 sets - 10 reps - 3s hold - Lying Prone  - 2-3 x daily - 7 x weekly - 3-5 min hold - Prone Gluteal Sets  - 2-3 x daily - 7 x weekly - 1 sets - 10 reps - 3s hold - Prone Knee Flexion  - 2-3 x daily - 7 x weekly - 3 sets - 10 reps - Seated Hamstring Stretch  - 2-3 x daily - 7 x weekly - 2 sets - 5 breaths hold  ASSESSMENT:  CLINICAL IMPRESSION: Patient is 11 weeks postop at this time.  Strength is demonstrates nearly symmetrical strength at the hip bilaterally between 90 to over 100%.  Patient's left knee extension strength is greater than right at this time, at 80% quad index.  Patient functional testing  shows that patient is able demonstrate single-leg hopping and single-leg jumping motions with good technique and good control and no pain.  Patient has started running progression as per MD, [facility] return to running protocol provided.  Patient home exercise program updated today for plyometrics.  Patient advised on no sprinting or change direction motions at this time.  Patient still with functional running limitations on right versus left given current healing process.  Plan to continue with plyometrics at future sessions and progression of running and running technique.  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  REHAB POTENTIAL: Good  CLINICAL DECISION MAKING: Stable/uncomplicated  EVALUATION COMPLEXITY: Low   GOALS: Goals reviewed with patient? Yes  SHORT TERM GOALS: Target date: 4 weeks 5/19  Pain free flexion to 90 Baseline: Goal status:achieved 6/4 5/28   2.  Demo controlled quad set with hold Baseline:  Goal status: achived 5/28    3.  30s sit to stand without pain or compensation Baseline:  Goal status: Ino problems 6/4   4.  SLS 30s without compensation Baseline:  Goal status: INITIAL  LONG TERM GOALS:  Able to demo step up on 6 step with level pelvis and proper form Baseline:  Goal status: MET Week 8 6/16  2.  Will tolerate at least 3 min on elliptical, demonstrating good tolerance to repetitive weight bearing motion Baseline:  Goal status: MET  Week 8 6/16  3.  Demonstrate proper form in at least 10 continuous lunges without increased pain Baseline:  Goal status: MET  Week 12 7/14  4.  Demonstrate gentle, double and single foot plyometric motions with good proximal form Baseline:  Goal status: ongoing Week 12 7/14  5. Pt will be able to demonstrate ability to SL depth drop jump without pain and with good technique in order to demonstrate functional  improvement and tolerance to  plyometric loading.    INITIAL  6. Pt will be able to demonstrate ability to perform 30 yrd straight line sprint without pain and with good technique in order to demonstrate functional improvement in running progression towards COD.     INITIAL   PLAN:  PT FREQUENCY: 1-2x/week until pt returns to school  PT DURATION: POC date  PLANNED INTERVENTIONS: 97164- PT Re-evaluation, 97750- Physical Performance Testing, 97110-Therapeutic exercises, 97530- Therapeutic activity, [id number]- Neuromuscular re-education, [phone]- Self Care, 02859- Manual therapy, [phone]- Gait training, [phone]- Aquatic Therapy, Patient/Family education, Balance training, Stair training, Taping, Dry Needling, Joint mobilization, Spinal  mobilization, Scar mobilization, Cryotherapy, and Moist heat.  PLAN FOR NEXT SESSION: per protocol  Dale Call PT, DPT 03/08/24 10:00 AM    For all possible CPT codes, reference the Planned Interventions line above.     Check all conditions that are expected to impact treatment: {Conditions expected to impact treatment:Musculoskeletal disorders and Unknown   If treatment provided at initial evaluation, no treatment charged due to lack of authorization.

## 2024-03-10 ENCOUNTER — Ambulatory Visit (HOSPITAL_BASED_OUTPATIENT_CLINIC_OR_DEPARTMENT_OTHER): Admitting: Physical Therapy

## 2024-03-10 ENCOUNTER — Encounter (HOSPITAL_BASED_OUTPATIENT_CLINIC_OR_DEPARTMENT_OTHER): Payer: Self-pay | Admitting: Physical Therapy

## 2024-03-10 DIAGNOSIS — M6281 Muscle weakness (generalized): Secondary | ICD-10-CM

## 2024-03-10 DIAGNOSIS — M25551 Pain in right hip: Secondary | ICD-10-CM | POA: Diagnosis not present

## 2024-03-10 DIAGNOSIS — R262 Difficulty in walking, not elsewhere classified: Secondary | ICD-10-CM

## 2024-03-10 NOTE — Therapy (Addendum)
 " OUTPATIENT PHYSICAL THERAPY EVALUATION  PHYSICAL THERAPY DISCHARGE SUMMARY  Visits from Start of Care: 11  Plan: Patient agrees to discharge.  Patient goals were not met. Patient is being discharged due to returning to college.        Patient Name: Shawn Novak MRN: [id number] DOB:01/19/2006, 18 y.o., male Today's Date: 03/10/2024  END OF SESSION:  PT End of Session - 03/10/24 0849     Visit Number 11    Number of Visits 20    Date for PT Re-Evaluation 06/06/24    Authorization Type M[employer] healthy blue    Authorization - Number of Visits 4    Progress Note Due on Visit --    PT Start Time 0847    PT Stop Time 0925    PT Time Calculation (min) 38 min    Activity Tolerance Patient tolerated treatment well    Behavior During Therapy [facility] for tasks assessed/performed              Past Medical History:  Diagnosis Date   Ulcerative colitis [facility])    Past Surgical History:  Procedure Laterality Date   BIOPSY  02/15/2023   Procedure: BIOPSY;  Surgeon: Federico Rosario BROCKS, MD;  Location: [facility];  Service: Gastroenterology;;   ENID SIGMOIDOSCOPY N/A 02/15/2023   Procedure: ENID MORIN;  Surgeon: Federico Rosario BROCKS, MD;  Location: [facility];  Service: Gastroenterology;  Laterality: N/A;   Patient Active Problem List   Diagnosis Date Noted   Femoroacetabular impingement of right hip 12/20/2023   Labral tear of right hip joint 11/04/2023   Dietary counseling and surveillance 02/25/2023   Ulcerative colitis (HCC) 02/15/2023   Severe diarrhea 02/14/2023   Stress fracture of fibula 02/05/2022   Strain of calf muscle 01/15/2021  Days since surgery: 108    REFERRING PROVIDER:  Genelle Standing, MD     REFERRING DIAG:  M25.859 (ICD-10-CM) - Femoroacetabular impingement    s/p Rt hip labral repair  Rationale for Evaluation and Treatment: Rehabilitation  THERAPY DIAG:  Pain in right hip  Muscle weakness (generalized)  Difficulty in walking, not  elsewhere classified  ONSET DATE: DOS 12/20/23  Days since surgery: 81   SUBJECTIVE:                                                                                                                                                                                           SUBJECTIVE STATEMENT:  Pt is 11 wks at this time. Pt did upper body and core this morning. He ran yesterday. His glute was mildly sore after last session with new jumping activity.  PERTINENT HISTORY:  none  PAIN:  Are you having pain? No  PRECAUTIONS:  None  RED FLAGS: None   WEIGHT BEARING RESTRICTIONS:  WBAT  FALLS:  Has patient fallen in last 6 months? No  LIVING ENVIRONMENT: Stairs in home  OCCUPATION:  Student  PLOF:  Independent  PATIENT GOALS:  Back to normal   OBJECTIVE:  Note: Objective measures were completed at Evaluation unless otherwise noted.  PATIENT SURVEYS:  LEFS 19/80  LEFS: 74/80  COGNITIVE STATUS: Within functional limits for tasks assessed   SENSATION: WFL   GAIT: WNL    AROM Right 7/9 Left 7/9  Hip flexion 120 120  Hip extension 10 10  Hip abduction    Hip adduction    Hip internal rotation 35 35  Hip external rotation 45 45  Knee flexion    Knee extension    Ankle dorsiflexion    Ankle plantarflexion    Ankle inversion    Ankle eversion     (Blank rows = not tested)      MMT Right 7/9 Left 7/9  Hip flexion 61.1 p! 56.6 lbs  Hip extension 67.7 60.7  Hip abduction 72.5 82.4  Hip adduction 65.1 68.5  Hip internal rotation 37.7 34.2  Hip external rotation 42.0 38.3  Knee flexion    Knee extension 49.9 64.0  Ankle dorsiflexion    Ankle plantarflexion    Ankle inversion    Ankle eversion     (Blank rows = not tested)    Functional Tests: 20x 8 step down  - passed  Double leg AP and ML hops 30s: passed Single leg AP an ML hops 30s: passed Jogging technique analysis: decreased hip flexion, donkey kick, increased COG vertical  displacement                                                                                                                           TREATMENT DATE:   7/11  Upright bike 5 min lvl 10   18 box jumps 4x6  DL to SL (compliant landing) 18 lateral step down 3x8 eccentric focus  Split squat jumps 4x6 18 box copenhagen 3x6 Hip flexor sit up 3x8 on bench (foot anchored with DB) Rotational squal jumps 3x10 (power focus)  7/9  A-skip doubles  Crossover hopping Power skips bounding bounds    Edu of exam results, HEP updates, return to exercise safety in weight room   6/13  Keiser bike warm up 6 min, gear 10 Self banded hip mob IR/ER, flexion, and ext   135lb rack pull 3x8 (mid thigh)  95lb RDL warm up Box squat 95lbs 2s pause 18 box 3x8 Sideplank 5s 2x10 Hip airplane 2x10 (holding rack) UE supported alternating lunge 2x10    6/11 Keiser bike warm up 6 min, gear 10  25lb goblet with heel prop 2x8 Box squat 95lbs 2s pause 18 box 2x8 RDL 95lbs 2x8 Bulg SS 2x10 SL RDL 2x10 Standing hip flexor march  6/6  Keiser bike warm up 6 min, gear 10  YTB hip flexor march supine 2x20 YTB sidestepping at toe box 90ft 3x Runner's step up 8 L and R 6 lateral step down R 2x10 Supine hip 90/90 IR/ER 3x10   PATIENT EDUCATION:  Education details: Anatomy of condition, POC, HEP, exercise form/rationale Person educated: Patient and Parent Education method: Explanation, Demonstration, Tactile cues, Verbal cues, and Handouts Education comprehension: verbalized understanding, returned demonstration, verbal cues required, tactile cues required, and needs further education  HOME EXERCISE PROGRAM: Access Code: [email or web address] URL: [email or web address] Exercises - Supine Quad Set  - 2-3 x daily - 7 x weekly - 1 sets - 10 reps - 3s hold - Lying Prone  - 2-3 x daily - 7 x weekly - 3-5 min hold - Prone Gluteal Sets  - 2-3 x daily - 7 x weekly - 1 sets - 10 reps - 3s hold -  Prone Knee Flexion  - 2-3 x daily - 7 x weekly - 3 sets - 10 reps - Seated Hamstring Stretch  - 2-3 x daily - 7 x weekly - 2 sets - 5 breaths hold  ASSESSMENT:  CLINICAL IMPRESSION: Patient is 11 weeks postop at this time.  Pt plyos progressed as well as hip flexor and adductor strengthening without pain or discomfort. Pt with expected weakness with new CKC strengthening but DL and modified SL plyos had good motor control with mirror cuing. Pt is progressing through running program at this time. Consider progressed to straight line decel movements at next. Pt will be returning to school the last week of July. Plan to continue with plyometrics at future sessions and progression of running and running technique.  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  REHAB POTENTIAL: Good  CLINICAL DECISION MAKING: Stable/uncomplicated  EVALUATION COMPLEXITY: Low   GOALS: Goals reviewed with patient? Yes  SHORT TERM GOALS: Target date: 4 weeks 5/19  Pain free flexion to 90 Baseline: Goal status:achieved 6/4 5/28   2.  Demo controlled quad set with hold Baseline:  Goal status: achived 5/28    3.  30s sit to stand without pain or compensation Baseline:  Goal status: Ino problems 6/4   4.  SLS 30s without compensation Baseline:  Goal status: INITIAL  LONG TERM GOALS:  Able to demo step up on 6 step with level pelvis and proper form Baseline:  Goal status: MET Week 8 6/16  2.  Will tolerate at least 3 min on elliptical, demonstrating good tolerance to repetitive weight bearing motion Baseline:  Goal status: MET  Week 8 6/16  3.  Demonstrate proper form in at least 10 continuous lunges without increased pain Baseline:  Goal status: MET  Week 12 7/14  4.  Demonstrate gentle, double and single foot plyometric motions with good proximal form Baseline:  Goal status: ongoing  Week 12 7/14  5. Pt will be able to demonstrate ability to SL depth drop jump without pain and with good technique in order to demonstrate functional improvement and tolerance to  plyometric loading.    INITIAL  6. Pt will be able to demonstrate ability to perform 30 yrd straight line sprint without pain and with good technique in order to demonstrate functional improvement in running progression towards COD.     INITIAL   PLAN:  PT FREQUENCY: 1-2x/week until pt returns to school  PT DURATION: POC date  PLANNED  INTERVENTIONS: 97164- PT Re-evaluation, 97750- Physical Performance Testing, 97110-Therapeutic exercises, 97530- Therapeutic activity, [id number]- Neuromuscular re-education, [id number]- Self Care, [id number]- Manual therapy, [phone]- Gait training, [phone]- Aquatic Therapy, Patient/Family education, Balance training, Stair training, Taping, Dry Needling, Joint mobilization, Spinal mobilization, Scar mobilization, Cryotherapy, and Moist heat.  PLAN FOR NEXT SESSION: per protocol  Dale Call PT, DPT 03/10/24 9:44 AM    For all possible CPT codes, reference the Planned Interventions line above.     Check all conditions that are expected to impact treatment: {Conditions expected to impact treatment:Musculoskeletal disorders and Unknown   If treatment provided at initial evaluation, no treatment charged due to lack of authorization.       "

## 2024-03-15 ENCOUNTER — Encounter (HOSPITAL_BASED_OUTPATIENT_CLINIC_OR_DEPARTMENT_OTHER): Admitting: Physical Therapy

## 2024-03-17 ENCOUNTER — Encounter (HOSPITAL_BASED_OUTPATIENT_CLINIC_OR_DEPARTMENT_OTHER): Admitting: Physical Therapy

## 2024-03-22 ENCOUNTER — Encounter (HOSPITAL_BASED_OUTPATIENT_CLINIC_OR_DEPARTMENT_OTHER): Admitting: Physical Therapy

## 2024-03-24 ENCOUNTER — Ambulatory Visit (HOSPITAL_BASED_OUTPATIENT_CLINIC_OR_DEPARTMENT_OTHER): Admitting: Physical Therapy

## 2024-03-29 ENCOUNTER — Encounter (HOSPITAL_BASED_OUTPATIENT_CLINIC_OR_DEPARTMENT_OTHER): Admitting: Physical Therapy

## 2024-03-31 ENCOUNTER — Encounter (HOSPITAL_BASED_OUTPATIENT_CLINIC_OR_DEPARTMENT_OTHER): Admitting: Physical Therapy

## 2024-07-03 ENCOUNTER — Encounter: Payer: Self-pay | Admitting: Radiology

## 2024-08-28 ENCOUNTER — Telehealth: Payer: Self-pay | Admitting: Sports Medicine

## 2024-08-28 NOTE — Telephone Encounter (Signed)
 Pt requesting an appointment for an ultrasound of the left tibia, pt states the pain has started back. Pt leaves to go back to college on the 09/09/24.

## 2024-08-30 ENCOUNTER — Telehealth: Payer: Self-pay | Admitting: Sports Medicine

## 2024-08-30 ENCOUNTER — Ambulatory Visit (INDEPENDENT_AMBULATORY_CARE_PROVIDER_SITE_OTHER): Admitting: Student

## 2024-08-30 DIAGNOSIS — M79662 Pain in left lower leg: Secondary | ICD-10-CM

## 2024-08-30 NOTE — Telephone Encounter (Signed)
 Pt came in office looking to be seen. Jinnie will call pt with n appt at a  later date

## 2024-08-30 NOTE — Progress Notes (Signed)
 "                                Chief Complaint: Left leg pain     History of Present Illness:    Shawn Novak is a 18 y.o. male who presents today for evaluation of pain in his left lower leg.  Patient has been followed in the past by Shawn Novak for stress reactions in the lower extremities including the right distal fibula in 2023, left distal tibia/fibula in 2023, and distal tibia in 2024.  These were initially identified on MRI scans but then were followed with ultrasound.  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  About 1 week ago, he began to have pain in the similar area in his left distal tibia region without any known cause.  He has not gone on any runs in at least 5 days.  He is worried about recurrence of a stress reaction.   Surgical History:   None  PMH/PSH/Family History/Social History/Meds/Allergies:    Past Medical History:  Diagnosis Date   Ulcerative colitis [facility])    Past Surgical History:  Procedure Laterality Date   BIOPSY  02/15/2023   Procedure: BIOPSY;  Surgeon: Shawn Rosario BROCKS, MD;  Location: [facility];  Service: Gastroenterology;;   Shawn SIGMOIDOSCOPY N/A 02/15/2023   Procedure: Shawn Novak;  Surgeon: Shawn Rosario BROCKS, MD;  Location: [facility];  Service: Gastroenterology;  Laterality: N/A;   Social History   Socioeconomic History   Marital status: Single    Spouse name: Not on file   Number of children: Not on file   Years of education: Not on file   Highest education level: Not on file  Occupational History   Not on file  Tobacco Use   Smoking status: Never   Smokeless tobacco: Never  Vaping Use   Vaping status: Never Used  Substance and Sexual Activity   Alcohol use: No   Drug use: No   Sexual activity: Not on file  Other Topics Concern   Not on file  Social History Narrative   Not on file   Social Drivers of Health   Tobacco Use: Low Risk (03/24/2024)   Received [facility]   Patient History    Passive Exposure: Never    Smokeless Tobacco Use: Never    Smoking Tobacco Use: Never  Financial Resource Strain: Not on file  Food Insecurity: No Food Insecurity (02/15/2023)   Hunger Vital Sign    Worried About Running Out of Food in the Last Year: Never true    Ran Out of Food in the Last Year: Never true  Transportation Needs: No Transportation Needs (02/15/2023)   PRAPARE - Administrator, Civil Service (Medical): No    Lack of Transportation (Non-Medical): No  Physical Activity: Not on file  Stress: No Stress Concern Present (07/28/2023)   Received from [facility] - Occupational Stress Questionnaire    Feeling of Stress : Not at all  Social Connections: Not on file  Depression (PHQ2-9): Not on file  Alcohol Screen: Not on file  Housing: Low Risk (02/15/2023)   Housing    Last Housing Risk Score: 0  Utilities: Not At Risk (02/15/2023)   AHC Utilities    Threatened with loss of utilities: No  Health Literacy: Not on file   Family History  Problem  Relation Age of Onset   Ulcerative colitis Father    Allergic rhinitis Neg Hx    Angioedema Neg Hx    Asthma Neg Hx    Eczema Neg Hx    Urticaria Neg Hx    Allergies[1] Current Outpatient Medications  Medication Sig Dispense Refill   aspirin  EC 325 MG tablet Take 1 tablet (325 mg total) by mouth daily. 14 tablet 0   dicyclomine  (BENTYL ) 20 MG tablet Take 1 tablet (20 mg total) by mouth 3 (three) times daily as needed for spasms. 60 tablet 1   fluticasone  (FLONASE ) 50 MCG/ACT nasal spray Place 2 sprays into both nostrils daily for 7 days. (Patient not taking: Reported on 12/09/2023) 1 mL 2   mesalamine  (APRISO ) 0.375 g 24 hr capsule Take 4 capsules (1.5 g total) by mouth daily. (Patient not taking: Reported on 12/09/2023) 360 capsule 3   oxyCODONE  (ROXICODONE ) 5 MG immediate release tablet Take 1 tablet (5 mg total) by mouth every 4 (four) hours as  needed for severe pain (pain score 7-10) or breakthrough pain. 30 tablet 0   Upadacitinib  ER (RINVOQ ) 15 MG TB24 Take 1 tablet (15 mg total) by mouth daily. 30 tablet 2   No current facility-administered medications for this visit.   No results found.  Review of Systems:   A ROS was performed including pertinent positives and negatives as documented in the HPI.  Physical Exam :   Constitutional: NAD and appears stated age Neurological: Alert and oriented Psych: Appropriate affect and cooperative There were no vitals taken for this visit.   Comprehensive Musculoskeletal Exam:    Exam of the left lower leg demonstrates no obvious deformity. There is tenderness to palpation over the medial aspect of the distal tibia approximately 3 inches proximal to the ankle mortise.  Fibula nontender.  Full active ankle range of motion without discomfort in all planes and with full strength.  Distal neurosensory exam is intact.  Imaging:     Assessment:   18 y.o. male with history of multiple bilateral lower extremity stress reactions presenting today with similar symptoms in the left medial distal tibia.  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  Patient would like to evaluate this with an ultrasound and forego an x-ray as these have not been conclusive in the past.  Bedside ultrasound performed today did not appear to show any obvious stress reactions or bony abnormalities.  Patient is aware that if symptoms worsen, he should continue holding off any high-level of activity and does have a walking boot if he needs to utilize this.  Patient will plan to schedule follow-up with Shawn Novak for reassessment, and to discuss further management options or possible return to activity.  Plan :    - Follow-up with Shawn Novak for further evaluation management     I personally saw and evaluated the patient, and participated in the management and treatment  plan.  Shawn Reveal, PA-C Orthopedics    [1] No Known Allergies  "

## 2024-09-01 ENCOUNTER — Ambulatory Visit (HOSPITAL_BASED_OUTPATIENT_CLINIC_OR_DEPARTMENT_OTHER): Admitting: Student

## 2024-09-01 NOTE — Telephone Encounter (Signed)
 Patient scheduled 09/04/2024 at 1:45pm.

## 2024-09-04 ENCOUNTER — Encounter: Payer: Self-pay | Admitting: Sports Medicine

## 2024-09-04 ENCOUNTER — Ambulatory Visit (INDEPENDENT_AMBULATORY_CARE_PROVIDER_SITE_OTHER): Admitting: Sports Medicine

## 2024-09-04 ENCOUNTER — Other Ambulatory Visit: Payer: Self-pay

## 2024-09-04 DIAGNOSIS — M79662 Pain in left lower leg: Secondary | ICD-10-CM

## 2024-09-04 DIAGNOSIS — S86899A Other injury of other muscle(s) and tendon(s) at lower leg level, unspecified leg, initial encounter: Secondary | ICD-10-CM | POA: Diagnosis not present

## 2024-09-04 DIAGNOSIS — K51019 Ulcerative (chronic) pancolitis with unspecified complications: Secondary | ICD-10-CM | POA: Diagnosis not present

## 2024-09-04 NOTE — Progress Notes (Signed)
 Patient says that he has had pain in the left lower leg for about a week. He began feeling pain while running, and stopped running as soon as it began to hurt. His pain is in the same location as a previous stress fracture, so he wanted to be cautious and proactive, rather than risking making it worse. He says that his shoes likely had, at most, 200 miles on them when he began running again. He has been wearing the boot that he was given with his last stress fracture since his onset of pain about a week ago. He has used ice, but otherwise has not done anything else to treat his pain at home.

## 2024-09-04 NOTE — Progress Notes (Signed)
 "  Shawn Novak - 19 y.o. male MRN [id number]  Date of birth: [birth year]  Office Visit Note: Visit Date: 09/04/2024 PCP: Cleotilde Lamar BROCKS, MD Referred by: Cleotilde Lamar BROCKS, MD  Subjective: Chief Complaint  Patient presents with   Left Leg - Pain   HPI: Shawn Novak is a pleasant 19 y.o. male who presents today for left tibia pain.  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 He states that he has had pain in the left lower leg for about the last 1-2 weeks. He began feeling pain while running (has been running same distance, 15 miles/week), and stopped running as soon as it began to hurt. His pain is in the same location as a previous stress fracture, so he wanted to be cautious and proactive, rather than risking making it worse. He says that his shoes likely had, at most, 200 miles on them when he began running again. He has been wearing the boot that he was given with his last stress fracture since his onset of pain about a week ago. He has used ice, but otherwise has not done anything else to treat his pain at home.   Hx of FAI, R-hip --> Coming off of Right labral tear repair Sept.  Notable orthopedic/Stress-injury history: -  Stress reaction/stress fracture back in June 2023 when he had a right distal fibula stress fracture and was out for at least 5 weeks.  Subsequently he started having pain on the other side as he got back into running and had an MRI around September 2023 which showed a Fredricksen grade 3 stress injury of the distal fibula as well as Fredricksen grade 1 stress injury of the distal tibia.   Pertinent ROS were reviewed with the patient and found to be negative unless otherwise specified above in HPI.   Assessment & Plan: Visit Diagnoses:  1. Medial tibial stress syndrome, initial encounter   2. Pain in left lower leg   3. Ulcerative pancolitis with complication (HCC)    Plan: Impression is early and improving distal  tibial stress reaction without evidence of stress fracture.  This is likely in the setting of returning from his right hip FAI status post labral repair.  He does have ulcerative colitis as well, will continue adequate nutrition and dietary intake as well as his Bentyl  20 mg TID Prn to control symptoms.  He does have a notable history of stress reactions and stress fractures, given this we did discuss return to activity protocol.  He has been in a cam walker boot for about 1 week, I would like him to continue this through Friday and then he may transition out of this.  No running x 2 weeks.  Starting after Friday and transition out of cam walker boot he may crosstraining with swimming and/or cycling x 1 week, week 2 may incorporate elliptical use.  As long as he progresses through crosstraining without reoccurrence of pain, he may then return to running at that point but would start at one third of the distance.  Overall increase would be 5 miles weekly week 1, 10 miles weekly week 2, back to 15 miles at baseline by the third week.  He will continue this and titrate up per his coach and training team at [facility]. I will see him back as needed.   *35 minutes was used for chart review, previous imaging, discussion on return to play/run protocol  Follow-up: Return if symptoms  worsen or fail to improve.   Meds & Orders: No orders of the defined types were placed in this encounter.   Orders Placed This Encounter  Procedures   US  Extrem Low Left Ltd     Procedures: No procedures performed      Clinical History: No specialty comments available.  He reports that he has never smoked. He has never used smokeless tobacco. No results for input(s): HGBA1C, LABURIC in the last 8760 hours.  Objective:    Physical Exam  Gen: Well-appearing, in no acute distress; non-toxic CV: Well-perfused. Warm.  Resp: Breathing unlabored on room air; no wheezing. Psych: Fluid speech in conversation; appropriate  affect; normal thought process  Ortho Exam - Left leg/tibia: + Focal tenderness at the distal medial aspect of the tibia.  Positive hop test left, negative right.  Full range of motion about the ankle joint.  Imaging: US  Extrem Low Left Ltd Result Date: 09/04/2024 Limited musculoskeletal ultrasound of the left tibia was performed today.  No cortical regularity or step-off noted from the mid to distal aspect of the tibia.  Overlying neurovasculature intact.  At the distal aspect of the medial tibia there is a mild hypoechoic fluid line just superior to the periosteum, minimal hyperemia here. Findings most consistent with healing stress reaction without evidence of stress fracture.    Past Medical/Family/Surgical/Social History: Medications & Allergies reviewed per EMR, new medications updated. Patient Active Problem List   Diagnosis Date Noted   Femoroacetabular impingement of right hip 12/20/2023   Labral tear of right hip joint 11/04/2023   Dietary counseling and surveillance 02/25/2023   Ulcerative colitis (HCC) 02/15/2023   Severe diarrhea 02/14/2023   Stress fracture of fibula 02/05/2022   Strain of calf muscle 01/15/2021   Past Medical History:  Diagnosis Date   Ulcerative colitis (HCC)    Family History  Problem Relation Age of Onset   Ulcerative colitis Father    Allergic rhinitis Neg Hx    Angioedema Neg Hx    Asthma Neg Hx    Eczema Neg Hx    Urticaria Neg Hx    Past Surgical History:  Procedure Laterality Date   BIOPSY  02/15/2023   Procedure: BIOPSY;  Surgeon: Federico Rosario BROCKS, MD;  Location: [facility];  Service: Gastroenterology;;   ENID SIGMOIDOSCOPY N/A 02/15/2023   Procedure: ENID MORIN;  Surgeon: Federico Rosario BROCKS, MD;  Location: [facility];  Service: Gastroenterology;  Laterality: N/A;   Social History   Occupational History   Not on file  Tobacco Use   Smoking status: Never   Smokeless tobacco: Never  Vaping Use   Vaping status: Never  Used  Substance and Sexual Activity   Alcohol use: No   Drug use: No   Sexual activity: Not on file   "

## 2024-09-07 ENCOUNTER — Telehealth: Payer: Self-pay | Admitting: Sports Medicine

## 2024-09-07 NOTE — Telephone Encounter (Signed)
 Called patient with response from Dr. Burnetta. He will follow up as needed.

## 2024-09-07 NOTE — Telephone Encounter (Signed)
 Pt called wanting to know if he needs to schedule an apt for another ultrasound on his left shin before he removes the boot and starts bearing weight on that leg. He said that he would like to schedule on or around 01/16. Call back number is [phone]

## 2024-10-26 ENCOUNTER — Ambulatory Visit: Admitting: Sports Medicine

## 2024-10-27 ENCOUNTER — Ambulatory Visit (HOSPITAL_BASED_OUTPATIENT_CLINIC_OR_DEPARTMENT_OTHER): Admitting: Orthopaedic Surgery
